# Patient Record
Sex: Female | Born: 1980
Health system: Southern US, Community
[De-identification: ages and names within clinical notes are randomized; demographics above are authoritative.]

## PROBLEM LIST (undated history)

## (undated) DIAGNOSIS — M21612 Bunion of left foot: Secondary | ICD-10-CM

## (undated) DIAGNOSIS — N390 Urinary tract infection, site not specified: Secondary | ICD-10-CM

## (undated) DIAGNOSIS — Z973 Presence of spectacles and contact lenses: Secondary | ICD-10-CM

## (undated) DIAGNOSIS — M546 Pain in thoracic spine: Secondary | ICD-10-CM

## (undated) DIAGNOSIS — M9902 Segmental and somatic dysfunction of thoracic region: Secondary | ICD-10-CM

## (undated) DIAGNOSIS — M5136 Other intervertebral disc degeneration, lumbar region: Secondary | ICD-10-CM

## (undated) DIAGNOSIS — M21611 Bunion of right foot: Secondary | ICD-10-CM

## (undated) DIAGNOSIS — Z8742 Personal history of other diseases of the female genital tract: Secondary | ICD-10-CM

## (undated) DIAGNOSIS — T7840XA Allergy, unspecified, initial encounter: Secondary | ICD-10-CM

## (undated) DIAGNOSIS — N870 Mild cervical dysplasia: Secondary | ICD-10-CM

## (undated) DIAGNOSIS — M5126 Other intervertebral disc displacement, lumbar region: Secondary | ICD-10-CM

## (undated) DIAGNOSIS — K219 Gastro-esophageal reflux disease without esophagitis: Secondary | ICD-10-CM

## (undated) DIAGNOSIS — L309 Dermatitis, unspecified: Secondary | ICD-10-CM

## (undated) DIAGNOSIS — M51369 Other intervertebral disc degeneration, lumbar region without mention of lumbar back pain or lower extremity pain: Secondary | ICD-10-CM

## (undated) DIAGNOSIS — G5600 Carpal tunnel syndrome, unspecified upper limb: Secondary | ICD-10-CM

## (undated) HISTORY — DX: Urinary tract infection, site not specified: N39.0

## (undated) HISTORY — DX: Allergy, unspecified, initial encounter: T78.40XA

## (undated) HISTORY — DX: Bunion of right foot: M21.611

## (undated) HISTORY — DX: Pain in thoracic spine: M54.6

## (undated) HISTORY — DX: Presence of spectacles and contact lenses: Z97.3

## (undated) HISTORY — DX: Dermatitis, unspecified: L30.9

## (undated) HISTORY — DX: Other intervertebral disc degeneration, lumbar region: M51.36

## (undated) HISTORY — DX: Other intervertebral disc degeneration, lumbar region without mention of lumbar back pain or lower extremity pain: M51.369

## (undated) HISTORY — DX: Gastro-esophageal reflux disease without esophagitis: K21.9

## (undated) HISTORY — DX: Segmental and somatic dysfunction of thoracic region: M99.02

## (undated) HISTORY — PX: COLPOSCOPY: SHX161

## (undated) HISTORY — DX: Carpal tunnel syndrome, unspecified upper limb: G56.00

## (undated) HISTORY — DX: Bunion of left foot: M21.611

## (undated) HISTORY — DX: Bunion of left foot: M21.612

## (undated) HISTORY — DX: Personal history of other diseases of the female genital tract: Z87.42

## (undated) HISTORY — DX: Mild cervical dysplasia: N87.0

## (undated) HISTORY — DX: Other intervertebral disc displacement, lumbar region: M51.26

---

## 2002-09-30 ENCOUNTER — Other Ambulatory Visit: Admission: RE | Admit: 2002-09-30 | Discharge: 2002-09-30 | Payer: Self-pay | Admitting: Obstetrics and Gynecology

## 2003-10-05 ENCOUNTER — Other Ambulatory Visit: Admission: RE | Admit: 2003-10-05 | Discharge: 2003-10-05 | Payer: Self-pay | Admitting: Obstetrics and Gynecology

## 2006-04-26 ENCOUNTER — Other Ambulatory Visit: Admission: RE | Admit: 2006-04-26 | Discharge: 2006-04-26 | Payer: Self-pay | Admitting: Obstetrics & Gynecology

## 2007-05-26 ENCOUNTER — Other Ambulatory Visit: Admission: RE | Admit: 2007-05-26 | Discharge: 2007-05-26 | Payer: Self-pay | Admitting: Obstetrics & Gynecology

## 2007-10-01 ENCOUNTER — Other Ambulatory Visit: Admission: RE | Admit: 2007-10-01 | Discharge: 2007-10-01 | Payer: Self-pay | Admitting: Obstetrics and Gynecology

## 2008-02-13 ENCOUNTER — Other Ambulatory Visit: Admission: RE | Admit: 2008-02-13 | Discharge: 2008-02-13 | Payer: Self-pay | Admitting: Obstetrics and Gynecology

## 2008-02-26 ENCOUNTER — Other Ambulatory Visit: Admission: RE | Admit: 2008-02-26 | Discharge: 2008-02-26 | Payer: Self-pay | Admitting: Obstetrics and Gynecology

## 2008-05-31 ENCOUNTER — Other Ambulatory Visit: Admission: RE | Admit: 2008-05-31 | Discharge: 2008-05-31 | Payer: Self-pay | Admitting: Obstetrics and Gynecology

## 2008-07-23 ENCOUNTER — Other Ambulatory Visit: Admission: RE | Admit: 2008-07-23 | Discharge: 2008-07-23 | Payer: Self-pay | Admitting: Obstetrics and Gynecology

## 2010-04-12 ENCOUNTER — Encounter
Admission: RE | Admit: 2010-04-12 | Discharge: 2010-05-09 | Payer: Self-pay | Source: Home / Self Care | Attending: Obstetrics and Gynecology | Admitting: Obstetrics and Gynecology

## 2010-06-27 ENCOUNTER — Inpatient Hospital Stay (HOSPITAL_COMMUNITY)
Admission: AD | Admit: 2010-06-27 | Discharge: 2010-06-29 | DRG: 775 | Disposition: A | Discharge status: Home / Self Care | Payer: 59 | Attending: Obstetrics and Gynecology | Admitting: Obstetrics and Gynecology

## 2010-06-27 LAB — CBC
HCT: 35.3 % — ABNORMAL LOW (ref 36.0–46.0)
Hemoglobin: 11.6 g/dL — ABNORMAL LOW (ref 12.0–15.0)
MCH: 26.2 pg (ref 26.0–34.0)
MCHC: 32.9 g/dL (ref 30.0–36.0)
MCV: 79.7 fL (ref 78.0–100.0)
Platelets: 216 10*3/uL (ref 150–400)
RBC: 4.43 MIL/uL (ref 3.87–5.11)
RDW: 14 % (ref 11.5–15.5)
WBC: 11.2 10*3/uL — ABNORMAL HIGH (ref 4.0–10.5)

## 2010-06-27 LAB — RPR: RPR Ser Ql: NONREACTIVE

## 2010-06-28 LAB — CBC
HCT: 27.8 % — ABNORMAL LOW (ref 36.0–46.0)
Hemoglobin: 9.2 g/dL — ABNORMAL LOW (ref 12.0–15.0)
MCH: 26.3 pg (ref 26.0–34.0)
MCHC: 33.1 g/dL (ref 30.0–36.0)
MCV: 79.4 fL (ref 78.0–100.0)
Platelets: 177 10*3/uL (ref 150–400)
RBC: 3.5 MIL/uL — ABNORMAL LOW (ref 3.87–5.11)
RDW: 14.3 % (ref 11.5–15.5)
WBC: 14.9 10*3/uL — ABNORMAL HIGH (ref 4.0–10.5)

## 2010-11-07 ENCOUNTER — Ambulatory Visit (INDEPENDENT_AMBULATORY_CARE_PROVIDER_SITE_OTHER): Payer: 59 | Admitting: Family Medicine

## 2010-11-07 ENCOUNTER — Encounter: Payer: Self-pay | Admitting: Family Medicine

## 2010-11-07 DIAGNOSIS — M999 Biomechanical lesion, unspecified: Secondary | ICD-10-CM

## 2010-11-07 DIAGNOSIS — M9981 Other biomechanical lesions of cervical region: Secondary | ICD-10-CM

## 2010-11-07 DIAGNOSIS — M9908 Segmental and somatic dysfunction of rib cage: Secondary | ICD-10-CM

## 2010-11-07 DIAGNOSIS — M546 Pain in thoracic spine: Secondary | ICD-10-CM

## 2010-11-07 DIAGNOSIS — M542 Cervicalgia: Secondary | ICD-10-CM

## 2010-11-07 DIAGNOSIS — M9901 Segmental and somatic dysfunction of cervical region: Secondary | ICD-10-CM

## 2010-11-07 DIAGNOSIS — M9902 Segmental and somatic dysfunction of thoracic region: Secondary | ICD-10-CM

## 2010-11-08 DIAGNOSIS — M9908 Segmental and somatic dysfunction of rib cage: Secondary | ICD-10-CM | POA: Insufficient documentation

## 2010-11-08 DIAGNOSIS — M546 Pain in thoracic spine: Secondary | ICD-10-CM | POA: Insufficient documentation

## 2010-11-08 DIAGNOSIS — M9902 Segmental and somatic dysfunction of thoracic region: Secondary | ICD-10-CM | POA: Insufficient documentation

## 2010-11-08 DIAGNOSIS — M542 Cervicalgia: Secondary | ICD-10-CM | POA: Insufficient documentation

## 2010-11-08 DIAGNOSIS — M9901 Segmental and somatic dysfunction of cervical region: Secondary | ICD-10-CM | POA: Insufficient documentation

## 2010-11-08 NOTE — Assessment & Plan Note (Signed)
Neck pain likely cervical strain related to recent pregnancy & daily activities.  Associated somatic dysfunction. - Osteopathic manipulation completed in office with 40-50% improvement in symptoms. - Recommend use of tennis ball or golf ball along next to help massage area - Instructed on home technique for suboccipital release - Will avoid any medications as this time since she is breast feeding - Moist heat - Follow-up as needed

## 2010-11-08 NOTE — Assessment & Plan Note (Signed)
Thoracic strain with associated somatic dysfunction - Osteopathic manipulation completed as noted below - Moist heat - Avoid any medications since breast feeding - Tennis ball massage prn - Follow-up as needed

## 2010-11-08 NOTE — Assessment & Plan Note (Signed)
(+)  TART, AA Rr, C3 RrSr E, C5 RlSl E - OMT completed in office with suboccipital release, cervical kneading, FPR, and HVLA.  Tolerated OMT well, somatic dysfunction improved, pain improved by 40-50%.

## 2010-11-08 NOTE — Assessment & Plan Note (Signed)
(+)  TART, T2-5 RrSl N - OMT completed in office with soft tissue techniques & HVLA.  Somatic dysfunction improved & patient's pain improved by 40-50% after treatment

## 2010-11-08 NOTE — Progress Notes (Signed)
  Subjective:    Patient ID: Lisa Ayers, female    DOB: December 27, 1980, 30 y.o.   MRN: [id number]  HPI 30yo female new patient to office for evaluation of neck pain & upper back pain.  Pain intermittent for the last 66-months following delivery of baby 70-months ago.  Had some issues with rib pain during pregnancy for which she went to physical therapy, but no significant back pain.  Hx of intermittent upper back pain since her late teens.  No injury or trauma.  Pain currently in lower neck & upper back.  No radiation into her arms.  No associated numbness/tingling, no weakness.  Flares typically occur every 2-3 months.  Pain occasionally wakes her at night.  She is currently breast feeding & tries to keep baby in comfortable position with a pillow & alternating sides regularly.  Has not taken anything for the pain b/c breast feeding.  Works as Producer, television/film/video for [employer].  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  Denies tobacco or alcohol use  Review of Systems Per HPI, otherwise negative    Objective:   Physical Exam GEN: AOx3, NAD, pleasant, well-developed, well-nourished SKIN: no rashes/lesions RESP: respirations 15 & unlabored MSK: - C-spine: Decreased ROM in all planes by 25% with pain.  No midline TTP, mild paraspinal TTP bilaterally - L>R, mild associated spasm left paraspinal muscles extending into left trapezius.  Negative Spurlings b/l. - T-spine: no gross deformity, no midline tenderness.  TTP along right paraspinal muscles T1-5, mild spasm in this area - L-spine: FROM without pain, no scoliosis, able to toe-walk & heel walk.  Normal lower extremity strength - Shoulders: FROM b/l without pain.  Normal RTC strength b/l, neg impingement testing, neg empty can.  Normal grip strength.  Normal upper extremity strength. - Gait: no leg length difference, normal heel-to-toe without a limp NEURO: sensation intact  to light touch, DTR +2/4 bicep, tricep, brachioradialis VASC: pulses 2+ & symmetric radial arteries b/l OSTEOPATHIC: - C-spine: (+)TART, AA Rr, C3 RrSr E, C5 RlSl E - T-spine: (+)TART, T2-5 RrSl N - Ribs: elevated right 1st rib - L-spine: no somatic dysfunction       Assessment & Plan:

## 2010-11-08 NOTE — Assessment & Plan Note (Signed)
Elevated Rt 1st rib - OMT completed in office with FPR of 1st rib.  Somatic dysfunction corrected.

## 2010-11-30 ENCOUNTER — Inpatient Hospital Stay (INDEPENDENT_AMBULATORY_CARE_PROVIDER_SITE_OTHER)
Admission: RE | Admit: 2010-11-30 | Discharge: 2010-11-30 | Disposition: A | Discharge status: Home / Self Care | Payer: 59 | Source: Ambulatory Visit

## 2010-11-30 DIAGNOSIS — R198 Other specified symptoms and signs involving the digestive system and abdomen: Secondary | ICD-10-CM

## 2010-11-30 DIAGNOSIS — S139XXA Sprain of joints and ligaments of unspecified parts of neck, initial encounter: Secondary | ICD-10-CM

## 2010-12-20 HISTORY — PX: INTRAUTERINE DEVICE INSERTION: SHX323

## 2011-02-16 ENCOUNTER — Inpatient Hospital Stay (INDEPENDENT_AMBULATORY_CARE_PROVIDER_SITE_OTHER)
Admission: RE | Admit: 2011-02-16 | Discharge: 2011-02-16 | Disposition: A | Discharge status: Home / Self Care | Payer: 59 | Source: Ambulatory Visit | Attending: Family Medicine | Admitting: Family Medicine

## 2011-02-16 DIAGNOSIS — R059 Cough, unspecified: Secondary | ICD-10-CM

## 2011-02-16 DIAGNOSIS — R05 Cough: Secondary | ICD-10-CM

## 2011-04-23 ENCOUNTER — Encounter: Payer: Self-pay | Admitting: *Deleted

## 2011-04-25 ENCOUNTER — Encounter: Payer: Self-pay | Admitting: Family Medicine

## 2011-04-25 ENCOUNTER — Ambulatory Visit (INDEPENDENT_AMBULATORY_CARE_PROVIDER_SITE_OTHER): Payer: 59 | Admitting: Family Medicine

## 2011-04-25 VITALS — BP 104/64 | HR 64 | Ht 66.0 in | Wt 157.0 lb

## 2011-04-25 DIAGNOSIS — L309 Dermatitis, unspecified: Secondary | ICD-10-CM | POA: Insufficient documentation

## 2011-04-25 DIAGNOSIS — K219 Gastro-esophageal reflux disease without esophagitis: Secondary | ICD-10-CM

## 2011-04-25 DIAGNOSIS — M542 Cervicalgia: Secondary | ICD-10-CM

## 2011-04-25 DIAGNOSIS — M25539 Pain in unspecified wrist: Secondary | ICD-10-CM

## 2011-04-25 DIAGNOSIS — L259 Unspecified contact dermatitis, unspecified cause: Secondary | ICD-10-CM

## 2011-04-25 MED ORDER — TRIAMCINOLONE ACETONIDE 0.1 % EX CREA: TOPICAL_CREAM | Freq: Two times a day (BID) | CUTANEOUS | Status: AC

## 2011-04-25 NOTE — Progress Notes (Signed)
Lisa Ayers is a new patient here to establish care. Some concerns she UJW:JXBJYN, hand/wrist pain b/l, back,neck and shoulder pain. Also heartburn x 4-5 years.  The only current/active concern is a spot of eczema on her L leg, requesting a steroid cream, as she has run out.  GERD--had reflux severely prior to pregnancy, somewhat better during pregnancy, and much improved now.  Currently takes Nexium just as needed, once a week. Seems to be triggered by baked goods, and crackers.  Eczema--has had x 10 years.  Was severe on her abdomen during a time where she was on ABX for chronic UTI's.  Now, just occasionally gets a spot on her legs.  Her creams have expired and is requesting a refill. She has a spot on her left thigh that has been present for about a month, mildly itchy.  Neck and shoulder pain--she gets cricks in her neck 2-3x/year, where she goes to an urgent care.  Yoga makes it worse initially, then gets better, but she hasn't had time for that.  Has been running recently, and it is improved.  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  Bilateral wrist pain since she had her baby 10 months ago.  Has pain with putting weight on her wrists, pronating/supinating hands, and when her daughter pulls on her thumbs.  Denies any numbness, tingling; no h/o carpal tunnel during pregnancy.  Hasn't tried anything for it--ie hasn't taken any NSAIDs.  Wore a carpal tunnel brace that someone gave her, but she couldn't wear it at work due to frequent hand washing. Wakes up with her hands hurting (no numbness)  History reviewed. No pertinent past medical history.  History reviewed. No pertinent past surgical history.  History   Social History  . Marital Status: Single    Spouse Name: N/A    Number of Children: 1  . Years of Education: N/A   Occupational History  . speech pathologist [place]   Social  History Main Topics  . Smoking status: Never Smoker   . Smokeless tobacco: Never Used  . Alcohol Use: Yes     0-1 drinks per week (still nursing).  previously drank 0-5 drinks/week  . Drug Use: No  . Sexually Active: Yes -- Female partner(s)    Birth Control/ Protection: IUD   Other Topics Concern  . Not on file   Social History Narrative   Lives with her boyfriend and her daughter, 1 dog    Family History  Problem Relation Age of Onset  . Depression Mother   . Diabetes Mother   . Diabetes Father   . Diabetes Maternal Grandmother   . Heart disease Maternal Grandmother   . Stroke Maternal Grandmother   . Cancer Paternal Grandmother 51    breast cancer   Current meds: Nexium 40 mg qd prn  Allergies  Allergen Reactions  . Sulfur Swelling    Of tongue.   ROS:  Denies fevers, URI symptoms, cough, shortness of breath, headaches, dizziness, chest pain, nausea, vomiting, bowel changes. +wrist pain, rash/eczema.  See HPI  PHYSICAL EXAM: BP 104/64  Pulse 64  Ht 5\' 6"  (1.676 m)  Wt 157 lb (71.215 kg)  BMI 25.34 kg/m2  LMP 04/12/2011 Well developed, pleasant female in no distress HEENT: PERRL, EOMI, conjunctiva clear, OP clear Neck: no lymphadenopathy, thyromegaly, carotid bruit or mass.  Spine nontender, no muscle spasm Back: no  spine or CVA tenderness Abdomen: soft, nontender, no organomegaly or mass Extremities: no clubbing, cyanosis or edema. 2+ pulses.  Negative Finkelstein test bilaterally.  Pain with position of Phalen test only, but did not cause symptoms into hand or elsewhere (just wrist discomfort in that flexed position).  Negative Tinel. Pain with supination and pronation against resistance. Normal strength Skin:  L lateral thigh--round, thickened, hyperkeratotic lesion.  ASSESSMENT/PLAN: 1. Eczema  triamcinolone cream (KENALOG) 0.1 %  2. GERD (gastroesophageal reflux disease)    3. Wrist pain    4. Neck pain     GERD--doing well overall.  Discussed reflux  precautions/diet, and to use Nexium prior to meal if known to cause reflux symptoms.  Discussed using faster-acting antacids once she already has symptoms  Numular eczema--moisturize; TAC 0.1% cream Neck and shoulder pain--shown stretches and strengthening exercises Wrist pain, bilateral--suspect overuse, inflammation.  Trial of wrist brace and OTC NSAID (Aleve up to 2 BID)

## 2011-04-25 NOTE — Patient Instructions (Signed)
Reflux--try seeing which foods trigger, and using the Nexium PRIOR to the meal, rather than waiting for symptoms. OR use mylanta/maalox or a Pepcid complete once you have the heartburn  Numular eczema--moisturize; TAC 0.1% cream twice daily for up to 2 weeks  Neck and shoulder pain--shown stretches and strengthening exercises--do these twice daily, 5-10 repetitions in each direction  Wrist pain, bilateral--suspect overuse, inflammation.  Trial of wrist braces and OTC NSAID (Aleve, up to 2 tablets twice daily--take with food, and stop if bothering your stomach)

## 2011-06-15 ENCOUNTER — Encounter: Payer: Self-pay | Admitting: Medical

## 2011-06-15 ENCOUNTER — Ambulatory Visit (INDEPENDENT_AMBULATORY_CARE_PROVIDER_SITE_OTHER): Payer: 59 | Admitting: Medical

## 2011-06-15 DIAGNOSIS — J029 Acute pharyngitis, unspecified: Secondary | ICD-10-CM | POA: Insufficient documentation

## 2011-06-15 DIAGNOSIS — H68009 Unspecified Eustachian salpingitis, unspecified ear: Secondary | ICD-10-CM

## 2011-06-15 MED ORDER — MOMETASONE FUROATE 50 MCG/ACT NA SUSP: 2.0000 | Freq: Every day | NASAL | Status: DC

## 2011-06-15 NOTE — Progress Notes (Signed)
Subjective: Mrs. Kiesel is here for c/o body aches, sore throat, chills, feeling exhausted, runny nose x 1 day.  She denies fever. Using Tylenol for pain.  Her right lymph node seems tender and swollen.  She c/o right ear fullness, some difficulty swallowing.  Denies cough, dizziness, nausea.  +sick contacts, works as Doctor, general practice.  ROS: Gen: no fever Cardiac: no cp Lungs: no SOB or wheezing GI: No NVD Neuro: no headache  Objective: Gen: WD, WN, NAD, mildly ill appearing HEENT: no sinus tenderness, right TM with mild erythema and serous effusion, left TM normal, turbinate swollen, mild erythema, pharynx with mildly enlarged tonsils, erythema, no exudate Cardiac: RRR, normal S1, S2 Lungs: CTA bilat, no wheezes, rhonchi or rales Neck: shoddy tender right sided lymphadenopathy, but no mass, no thyromegaly, supple  Assessment: Encounter Diagnoses  Name Primary?  . Eustachian salpingitis Yes  . Pharyngitis     Plan: Viral etiology at current.  Discussed supportive care, good hydration, sample of Nasonex, c/t Tylenol for pain, call if worse or not improving.

## 2011-06-15 NOTE — Progress Notes (Signed)
Addended by: Jac Canavan on: 06/15/2011 08:20 PM   Modules accepted: Orders

## 2011-06-15 NOTE — Patient Instructions (Signed)
Use the Nasonex nasal spray, 1-2 sprays per nostril, once to twice daily for nasal congestion.  Consider OTC Benadryl or Zyrtec for congestion, hydrate well, rest.  Call if not much improved by early next week.   Barotitis Media Barotitis media is soreness (inflammation) of the area behind the eardrum (middle ear). This occurs when the auditory tube (Eustachian tube) leading from the back of the throat to the eardrum is blocked. When it is blocked air cannot move in and out of the middle ear to equalize pressure changes. These pressure changes come from changes in altitude when:  Flying.   Driving in the mountains.   Diving.  Problems are more likely to occur with pressure changes during times when you are congested as from:  Hay fever.   Upper respiratory infection.   A cold.  Damage or hearing loss (barotrauma) caused by this may be permanent. HOME CARE INSTRUCTIONS   Use medicines as recommended by your caregiver. Over the counter medicines will help unblock the canal and can help during times of air travel.   Do not put anything into your ears to clean or unplug them. Eardrops will not be helpful.   Do not swim, dive, or fly until your caregiver says it is all right to do so. If these activities are necessary, chewing gum with frequent swallowing may help. It is also helpful to hold your nose and gently blow to pop your ears for equalizing pressure changes. This forces air into the Eustachian tube.   For little ones with problems, give your baby a bottle of water or juice during periods when pressure changes would be anticipated such as during take offs and landings associated with air travel.   Only take over-the-counter or prescription medicines for pain, discomfort, or fever as directed by your caregiver.   A decongestant may be helpful in de-congesting the middle ear and make pressure equalization easier. This can be even more effective if the drops (spray) are delivered with  the head lying over the edge of a bed with the head tilted toward the ear on the affected side.   If your caregiver has given you a follow-up appointment, it is very important to keep that appointment. Not keeping the appointment could result in a chronic or permanent injury, pain, hearing loss and disability. If there is any problem keeping the appointment, you must call back to this facility for assistance.  SEEK IMMEDIATE MEDICAL CARE IF:   You develop a severe headache, dizziness, severe ear pain, or bloody or pus-like drainage from your ears.   An oral temperature above 102 F (38.9 C) develops.   Your problems do not improve or become worse.  MAKE SURE YOU:   Understand these instructions.   Will watch your condition.   Will get help right away if you are not doing well or get worse.  Document Released: 05/11/2000 Document Revised: 01/24/2011 Document Reviewed: 12/18/2007 [facility] [employer] [facility] Patient Information 2012 [place], [place].

## 2011-06-19 LAB — POCT RAPID STREP A (OFFICE): Rapid Strep A Screen: NEGATIVE

## 2011-06-21 ENCOUNTER — Telehealth: Payer: Self-pay | Admitting: Family Medicine

## 2011-06-21 ENCOUNTER — Other Ambulatory Visit: Payer: Self-pay | Admitting: Medical

## 2011-06-21 MED ORDER — AMOXICILLIN 875 MG PO TABS: 875.0000 mg | ORAL_TABLET | Freq: Two times a day (BID) | ORAL | Status: AC

## 2011-06-21 NOTE — Telephone Encounter (Signed)
rx for amoxicillin sent 

## 2011-06-21 NOTE — Telephone Encounter (Signed)
Pt called and her ear is worse, she states ear infection now.  Please call in rx to [facility] outpt pharm

## 2011-07-10 ENCOUNTER — Ambulatory Visit (INDEPENDENT_AMBULATORY_CARE_PROVIDER_SITE_OTHER): Payer: 59 | Admitting: Medical

## 2011-07-10 ENCOUNTER — Encounter: Payer: Self-pay | Admitting: Medical

## 2011-07-10 VITALS — BP 110/68 | HR 64 | Temp 97.6°F | Resp 18 | Wt 154.0 lb

## 2011-07-10 DIAGNOSIS — J329 Chronic sinusitis, unspecified: Secondary | ICD-10-CM | POA: Insufficient documentation

## 2011-07-10 MED ORDER — FLUCONAZOLE 150 MG PO TABS: 150.0000 mg | ORAL_TABLET | Freq: Once | ORAL | Status: AC

## 2011-07-10 MED ORDER — AMOXICILLIN-POT CLAVULANATE 875-125 MG PO TABS: 1.0000 | ORAL_TABLET | Freq: Two times a day (BID) | ORAL | Status: AC

## 2011-07-10 NOTE — Progress Notes (Signed)
Subjective:  Lisa Ayers is a 31 y.o. female who presents for sinuses concerns.  Saw me 06/15/11 for sinus concerns, had viral URI at that time, but developed ear pain and worse symptoms within a few days.  She reports nasal congestion, thick yellow consistent nasal discharge, stuffy head, no taste currently.  Amoxicillin was sent, she got mostly better, but still not completely resolved.  Today she notes nasal congestion, thick yellow nasal drainage, sinus pressure, lingering symptoms.   Using Sudafed. +sick contacts - works in Teacher, music.  Going to [employer] this week for a wedding.  Gets sinus infections 1-2 times per year.  Denies recent allergy problems.  She is still nursing.  No other aggravating or relieving factors.  No other c/o.  Past Medical History  Diagnosis Date  . GERD (gastroesophageal reflux disease)    ROS: Gen: no fever, chills, sweats GI: no abdominal pain, NVD Lungs: no SOB, wheezing Herat: no CP, palpitations   Objective:   Filed Vitals:   07/10/11 0941  BP: 110/68  Pulse: 64  Temp: 97.6 F (36.4 C)  Resp: 18    General appearance: Alert, WD/WN, no distress, nasal sounding                             Skin: warm, no rash                           Head: +frontal sinus tenderness                            Eyes: conjunctiva normal, corneas clear, PERRLA                            Ears: TM with decreased light reflex, external ear canals normal                          Nose: septum midline, turbinates swollen, with erythema and clear discharge             Mouth/throat: MMM, tongue normal, mild pharyngeal erythema                           Neck: supple, no adenopathy, no thyromegaly, nontender                          Heart: RRR, normal S1, S2, no murmurs                         Lungs: CTA bilaterally, no wheezes, rales, or rhonchi      Assessment and Plan:   Encounter Diagnosis  Name Primary?  . Sinusitis Yes   Prescription given for Augmentin, and script  for Diflucan in the event of secondary yeast infection.  Can c/t OTC decongestant she has been using for congestion.  Tylenol OTC for fever and malaise.  Reassured that she can use nasal saline.  Discussed symptomatic relief, nasal saline, and call or return if worse or not improving in 2-3 days.

## 2011-07-30 ENCOUNTER — Ambulatory Visit (INDEPENDENT_AMBULATORY_CARE_PROVIDER_SITE_OTHER): Payer: 59 | Admitting: Medical

## 2011-07-30 ENCOUNTER — Encounter: Payer: Self-pay | Admitting: Medical

## 2011-07-30 VITALS — BP 110/80 | HR 68 | Temp 98.0°F | Resp 16 | Wt 157.0 lb

## 2011-07-30 DIAGNOSIS — J069 Acute upper respiratory infection, unspecified: Secondary | ICD-10-CM

## 2011-07-30 DIAGNOSIS — H109 Unspecified conjunctivitis: Secondary | ICD-10-CM

## 2011-07-30 MED ORDER — PSEUDOEPH-HYDROCODONE 60-5 MG/5ML PO SOLN: 5.0000 mL | Freq: Every evening | ORAL | Status: DC | PRN

## 2011-07-30 MED ORDER — BACITRACIN 500 UNIT/GM OP OINT: TOPICAL_OINTMENT | Freq: Four times a day (QID) | OPHTHALMIC | Status: AC

## 2011-07-30 MED ORDER — AZELASTINE HCL 0.1 % NA SOLN: 1.0000 | Freq: Two times a day (BID) | NASAL | Status: DC

## 2011-07-30 NOTE — Progress Notes (Signed)
Subjective:  Lisa Ayers is a 31 y.o. female who presents for 1 day hx/o left eye symptoms.  She notes left eye this morning was runny, goopy, matted shut, red, and thinks she has pink eye. No sick contacts with similar.  She also notes daughter started getting sick few days ago, then she has now started having nasal congestion, head cold, cough, and not sleeping due to cough.  No other aggravating or relieving factors.  No other c/o.  Past Medical History  Diagnosis Date  . GERD (gastroesophageal reflux disease)    ROS  Gen: no fever, chills, sweats GI: no NVD GU: negative Lungs: no SOB, wheezing As noted in HPI otherwise  Objective:   Filed Vitals:   07/30/11 1012  BP: 110/80  Pulse: 68  Temp: 98 F (36.7 C)  Resp: 16    General appearance: Alert, WD/WN, no distress, mildly ill appearing                             Skin: warm, no rash                           Head: no sinus tenderness                            Eyes: left conjunctiva injected, no obvious purulent discharge, no FB, corneas clear, PERRLA                            Ears: pearly TMs, external ear canals normal                          Nose: septum midline, turbinates swollen, with erythema and clear discharge             Mouth/throat: MMM, tongue normal, mild pharyngeal erythema                           Neck: supple, no adenopathy, no thyromegaly, nontender                          Heart: RRR, normal S1, S2, no murmurs                         Lungs: CTA bilaterally, no wheezes, rales, or rhonchi     Assessment and Plan:   Encounter Diagnoses  Name Primary?  . Conjunctivitis Yes  . URI (upper respiratory infection)    Conjunctiva -  discussed diagnosis, treatment, prevention, script for Bacitracin ointment.  URI - Samples of cough syrup prn QHS as below, sample of Astepro for nasal congestion since Nasonex hasn't helped much.  Call/return in 2-3 days if symptoms aren't resolving.

## 2011-07-30 NOTE — Patient Instructions (Signed)
Conjunctivitis Conjunctivitis is commonly called "pink eye." Conjunctivitis can be caused by bacterial or viral infection, allergies, or injuries. There is usually redness of the lining of the eye, itching, discomfort, and sometimes discharge. There may be deposits of matter along the eyelids. A viral infection usually causes a watery discharge, while a bacterial infection causes a yellowish, thick discharge. Pink eye is very contagious and spreads by direct contact. You may be given antibiotic eyedrops as part of your treatment. Before using your eye medicine, remove all drainage from the eye by washing gently with warm water and cotton balls. Continue to use the medication until you have awakened 2 mornings in a row without discharge from the eye. Do not rub your eye. This increases the irritation and helps spread infection. Use separate towels from other household members. Wash your hands with soap and water before and after touching your eyes. Use cold compresses to reduce pain and sunglasses to relieve irritation from light. Do not wear contact lenses or wear eye makeup until the infection is gone. SEEK MEDICAL CARE IF:   Your symptoms are not better after 3 days of treatment.   You have increased pain or trouble seeing.   The outer eyelids become very red or swollen.  Document Released: 06/21/2004 Document Revised: 05/03/2011 Document Reviewed: 05/14/2005 [employer] Patient Information 2012 [employer]. 

## 2012-03-17 LAB — HM PAP SMEAR: HM Pap smear: POSITIVE

## 2012-06-25 ENCOUNTER — Ambulatory Visit (INDEPENDENT_AMBULATORY_CARE_PROVIDER_SITE_OTHER): Payer: 59 | Admitting: Medical

## 2012-06-25 ENCOUNTER — Encounter: Payer: Self-pay | Admitting: Medical

## 2012-06-25 VITALS — BP 100/70 | HR 72 | Temp 98.6°F | Resp 14 | Wt 163.0 lb

## 2012-06-25 DIAGNOSIS — H6690 Otitis media, unspecified, unspecified ear: Secondary | ICD-10-CM

## 2012-06-25 DIAGNOSIS — J329 Chronic sinusitis, unspecified: Secondary | ICD-10-CM

## 2012-06-25 MED ORDER — CEFUROXIME AXETIL 500 MG PO TABS: 500.0000 mg | ORAL_TABLET | Freq: Two times a day (BID) | ORAL | Status: DC

## 2012-06-25 MED ORDER — CEFTRIAXONE SODIUM 1 G IJ SOLR
1.0000 g | Freq: Once | INTRAMUSCULAR | Status: AC
  Administered 2012-06-25: 1 g via INTRAMUSCULAR

## 2012-06-25 NOTE — Progress Notes (Signed)
Subjective:  Lisa Ayers is a 32 y.o. female who presents for illness.  Past history is significant for occasional hx/o sinusitis. Patient is a non-smoker.  Using sudafed for symptoms.  Daughter sick recently with RSV.   Husband and her both had colds recently, and she just hasn't gotten over the symptoms.   She notes over 1.5 wk of sinus pressure, cough, congestion, ears have been muffled, but right ear with lots of pain that started yesterday.  Denies SOB, CP, wheezing, NVD, fever.  No other aggravating or relieving factors.  No other c/o.   Past Medical History  Diagnosis Date  . GERD (gastroesophageal reflux disease)    ROS as above in subjective   Objective: Filed Vitals:   06/25/12 1436  BP: 100/70  Pulse: 72  Temp: 98.6 F (37 C)  Resp: 14   General appearance: Alert, WD/WN, no distress                             Skin: warm, no rash                           Head: +maxillary sinus tenderness,                            Eyes: conjunctiva normal, corneas clear, PERRLA                            Ears: right TM with erythema and retraction, left TM flat, external ear canals normal                          Nose: septum midline, turbinates swollen, with erythema and clear discharge             Mouth/throat: MMM, tongue normal, mild pharyngeal erythema                           Neck: supple, no adenopathy, no thyromegaly, nontender                          Heart: RRR, normal S1, S2, no murmurs                         Lungs: CTA bilaterally, no wheezes, rales, or rhonchi      Assessment and Plan:   Encounter Diagnoses  Name Primary?  . Otitis media Yes  . Sinusitis    Discussed findings, diagnosis.   Gave 1gram Rocephin IM given in office.  Prescription given for Ceftin..  Can use OTC Mucinex for congestion.  Tylenol or Ibuprofen OTC for fever and malaise.  Discussed symptomatic relief, nasal saline, and call or return if worse or not improving in 3-5 days.

## 2012-06-25 NOTE — Addendum Note (Signed)
Addended by: Janeice Robinson on: 06/25/2012 03:59 PM   Modules accepted: Orders

## 2012-08-05 ENCOUNTER — Ambulatory Visit (INDEPENDENT_AMBULATORY_CARE_PROVIDER_SITE_OTHER): Payer: 59 | Admitting: Sports Medicine

## 2012-08-05 VITALS — BP 106/70 | Ht 66.0 in | Wt 157.0 lb

## 2012-08-05 DIAGNOSIS — R269 Unspecified abnormalities of gait and mobility: Secondary | ICD-10-CM | POA: Insufficient documentation

## 2012-08-05 DIAGNOSIS — M21611 Bunion of right foot: Secondary | ICD-10-CM | POA: Insufficient documentation

## 2012-08-05 DIAGNOSIS — M25572 Pain in left ankle and joints of left foot: Secondary | ICD-10-CM

## 2012-08-05 DIAGNOSIS — M21619 Bunion of unspecified foot: Secondary | ICD-10-CM

## 2012-08-05 DIAGNOSIS — M25579 Pain in unspecified ankle and joints of unspecified foot: Secondary | ICD-10-CM

## 2012-08-05 NOTE — Progress Notes (Signed)
Patient ID: Lisa Ayers, female   DOB: 05-05-1981, 32 y.o.   MRN: [id number]  HPI  Started running 2 years ago Left medial ankle started bothering her about 9 mos ago Started with running fast down a hill on 1 specific run Now pain comes back by 2 miles into run She has been able to keep running but cannot extend her runs more than 2-3 miles because of the pain Sharp pain and goes away quickly  Runs 15 MPW Tried first step orthotic but was uncomfortable in the arch.  Left lateral knee will hurt after left medial ankle if she pushes it  No swelling No giving way  Hx of bad ankle sprain in college  P exam NAD  Feet show Morton,s foot.  Subluxation of L ankle with marked pronation.  R foot moderate pronation, hallux valgus and early bunion formation.  Slight hallux valgus on the left/  no bunion.  No spinal scoliosis. Leg lengths equal. 5/5 strength on leg abductors bilaterally.  5/5 strength posterior tibialis muscle. ATF   ligament intact but slightly Loose drawer on R ankle  Running gait is neutral except for bilateral pronation as noted on static exam

## 2012-08-05 NOTE — Assessment & Plan Note (Signed)
We will try adding some soft arch support  Probably will need custom orthotics as already a lot of static breakdown  See if this lessens ankle pain

## 2012-08-05 NOTE — Assessment & Plan Note (Signed)
Hopefully a custom orthotic will slow the progression of her bunion

## 2012-08-05 NOTE — Assessment & Plan Note (Signed)
Exercises  Consider trial of ankle compression wrap  Recheck after HEP for 6 weeks

## 2012-10-22 ENCOUNTER — Ambulatory Visit (INDEPENDENT_AMBULATORY_CARE_PROVIDER_SITE_OTHER): Payer: 59 | Admitting: Family Medicine

## 2012-10-22 ENCOUNTER — Encounter: Payer: Self-pay | Admitting: Family Medicine

## 2012-10-22 VITALS — BP 108/64 | HR 72 | Temp 98.3°F | Ht 67.0 in | Wt 160.0 lb

## 2012-10-22 DIAGNOSIS — J069 Acute upper respiratory infection, unspecified: Secondary | ICD-10-CM

## 2012-10-22 NOTE — Patient Instructions (Signed)
Drink plenty of fluids. Use guaifenesin round the clock as directed (the one you have, use every 4 hours, vs getting a 12 hr version--Mucinex--to use twice daily. Continue use of decongestant as needed for nasal congestion, sinus pressure. Use sinus rinses (neti-pot or sinus rinse kit) once or twice daily to keep sinuses clear.  Call if symptoms last >10 days, if you develop fever, sinus pain, etc.

## 2012-10-22 NOTE — Progress Notes (Signed)
Chief Complaint  Patient presents with  . cold or allergies    sore throat on monday evening and then feeling bad tuesday and today   2 nights ago started with sore throat.  Yesterday she had increased fatigue, postnasal drainage; productive cough started last night.  Today she is having more sinus congestion and cough.  Denies fevers.  +sick contacts.  Nasal mucus is light yellow.  Unable to expectorate the phlegm, seems somewhat thick.  Denies shortness of breath or wheezing, but had some tightness/congestion in throat earlier today.  Has used guaifenesin at bedtime last night, and this morning (a 4 hr version); took sudafed this morning.  She started back on the Astepro last night.  Has not been using Nasonex.  Just restarted the claritin 2 days ago.  She has been having flare ups every 2 months with respiratory symptoms, similar to today.  She has a 2 yo daughter in daycare, who is also coughing today.  Past Medical History  Diagnosis Date  . GERD (gastroesophageal reflux disease)    History   Social History  . Marital Status: Single    Spouse Name: N/A    Number of Children: 1  . Years of Education: N/A   Occupational History  . speech pathologist [employer]   Social History Main Topics  . Smoking status: Never Smoker   . Smokeless tobacco: Never Used  . Alcohol Use: Yes     Comment: 0-1 drinks per week (still nursing).  previously drank 0-5 drinks/week  . Drug Use: No  . Sexually Active: Yes -- Female partner(s)    Birth Control/ Protection: IUD   Other Topics Concern  . Not on file   Social History Narrative   Lives with her boyfriend and her daughter, 1 dog    Current outpatient prescriptions:famotidine (PEPCID) 10 MG tablet, Take 10 mg by mouth at bedtime., Disp: , Rfl: ;  GUAIFENESIN PO, Take 1 tablet by mouth every 6 (six) hours as needed (cough)., Disp: , Rfl: ;  Loratadine (CLARITIN PO), Take by mouth., Disp: , Rfl: ;  azelastine (ASTEPRO) 137 MCG/SPRAY nasal  spray, Place 1 spray into the nose 2 (two) times daily. Use in each nostril as directed, Disp: 15 mL, Rfl: 0 mometasone (NASONEX) 50 MCG/ACT nasal spray, Place 2 sprays into the nose daily., Disp: 17 g, Rfl: 12;  Multiple Vitamin (MULTIVITAMIN) tablet, Take 1 tablet by mouth daily.  , Disp: , Rfl:  Took a sudafed this morning.  Allergies  Allergen Reactions  . Sulfur Swelling    Of tongue.   ROS:  Denies fevers, nausea, vomiting, diarrhea, skin rash, sinus pain, myalgias, abdominal pain or other symptoms.  PHYSICAL EXAM: BP 108/64  Pulse 72  Temp(Src) 98.3 F (36.8 C) (Oral)  Ht 5\' 7"  (1.702 m)  Wt 160 lb (72.576 kg)  BMI 25.05 kg/m2 Pleasant female, in no distress.  No coughing noted. HEENT:  Nasal mucosa moderately edematous, no erythema, whitish mucus in nares.  Sinuses nontender.  OP normal (wisdom teeth visible). Neck: no lymphadenopathy Heart: regular rate and rhythm without murmur Lungs: clear bilaterally Skin: no rash Psych: normal mood, affect Neuro: alert and oriented, cranial nerves intact, normal gait  ASSESSMENT/PLAN:  Acute upper respiratory infections of unspecified site  Supportive measures reviewed.  Recommended increased fluids, guaifenesin, decongestants.  Sounds more viral than allergic--can hold off on restarting the nasonex, astepro.  May use claritin if needed to help with drying, or for any allergy symptoms.  Reviewed  signs/symptoms of bacterial infection and call or f/u if symptoms persist/worsen

## 2013-05-06 ENCOUNTER — Encounter: Payer: Self-pay | Admitting: Medical

## 2013-05-06 ENCOUNTER — Ambulatory Visit (INDEPENDENT_AMBULATORY_CARE_PROVIDER_SITE_OTHER): Payer: 59 | Admitting: Medical

## 2013-05-06 VITALS — BP 102/70 | HR 72 | Temp 98.0°F | Resp 16 | Wt 154.0 lb

## 2013-05-06 DIAGNOSIS — H6690 Otitis media, unspecified, unspecified ear: Secondary | ICD-10-CM

## 2013-05-06 DIAGNOSIS — H6691 Otitis media, unspecified, right ear: Secondary | ICD-10-CM

## 2013-05-06 MED ORDER — MONTELUKAST SODIUM 10 MG PO TABS: 10.0000 mg | ORAL_TABLET | Freq: Every day | ORAL | Status: DC

## 2013-05-06 MED ORDER — FLUTICASONE PROPIONATE 50 MCG/ACT NA SUSP: 2.0000 | Freq: Every day | NASAL | Status: DC

## 2013-05-06 MED ORDER — AMOXICILLIN-POT CLAVULANATE 875-125 MG PO TABS: 1.0000 | ORAL_TABLET | Freq: Two times a day (BID) | ORAL | Status: DC

## 2013-05-06 NOTE — Progress Notes (Signed)
Subjective: Here for c/o ear pain, bilat.  Started 3 days ago with pain in right ear, ear fullness and loss of hearing bilat.  Has had slight sore throat, slight stuffy nose. Doesn't feel like the normal cold.  Has had sick contacts, works at hospital.  lymph nodes have felt swollen.  Has been taking ibuprofen.  Denies ear infection as a child, but has had ear infections the last 5 years . Was seen here for last one.  Denies ear drainage, fever, NVD, no cough, no sinus pressure.     Objective: Gen: wd, wn,nad  Skin: warm, dry HEENT: sinus nontender, conjunctiva normal, right TM with purulent effusion, erythema anteriorly, bulging TM, left TM normal, nares parent, pharynx with post nasal drainage Neck supple nontender, no lymphadenopathy, no mass Lungs clear   Assessment: Encounter Diagnosis  Name Primary?  . Otitis media, right Yes    Plan: Begin Augmentin, OTC decongestant short term.   Hydrate well.  Given several URI, sinusitis, ear infections in the past year, discuss preventative option and reasons for frequent URI.  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   She will c/t efforts at good hygiene, hand washing, will begin Singulair daily, Flonase nasal spray, and will use this regimen presumably through May/summer time.  If she continues to get documented sinusitis or OM, she may need to see ENT.  Recheck in 2wk on right OM.

## 2013-05-06 NOTE — Patient Instructions (Signed)
Begin Singulair daily at bedtime for allergy prevention.  Begin Flonase 1-2 sprays per nostril twice daily for a week, then once daily for at least several weeks.    Begin short term Augmentin for ear infection.    Consider neti pot/nasal saline regularly.

## 2013-05-29 ENCOUNTER — Ambulatory Visit (INDEPENDENT_AMBULATORY_CARE_PROVIDER_SITE_OTHER): Payer: 59 | Admitting: Medical

## 2013-05-29 VITALS — BP 110/80 | Temp 98.3°F | Wt 156.0 lb

## 2013-05-29 DIAGNOSIS — H68009 Unspecified Eustachian salpingitis, unspecified ear: Secondary | ICD-10-CM

## 2013-05-29 DIAGNOSIS — H68003 Unspecified Eustachian salpingitis, bilateral: Secondary | ICD-10-CM

## 2013-05-29 DIAGNOSIS — K219 Gastro-esophageal reflux disease without esophagitis: Secondary | ICD-10-CM

## 2013-05-29 MED ORDER — ALIGN 4 MG PO CAPS
1.0000 | ORAL_CAPSULE | Freq: Every day | ORAL | Status: DC
Start: 2013-05-29 — End: 2016-01-11

## 2013-05-29 MED ORDER — ESOMEPRAZOLE MAGNESIUM 40 MG PO CPDR: 40.0000 mg | DELAYED_RELEASE_CAPSULE | Freq: Every day | ORAL | Status: DC

## 2013-05-29 NOTE — Progress Notes (Signed)
                                                             Subjective:  Clearance Lisa Ayers is a 33 y.o. female who presents for recheck on ears and GERD.  Improved from the Augmentin, was doing much better, but still feels some right sided ear and neck pressure.  Wanted to recheck.  She is using Singulair and Flonase.    Hx/o GERD.  In the past has had significant problems with reflux.  When drinking alcohol and eating certain foods, has vomiting.  Augmentin really tore her stomach up.   Takes pepcid daily.   Has failed omeprazole, protonix, and ranitidine prior.  No prior EGD or GI visit.    No other aggravating or relieving factors.    No other c/o.  The following portions of the patient's history were reviewed and updated as appropriate: allergies, current medications, past family history, past medical history, past social history, past surgical history and problem list.  ROS Otherwise as in subjective above  Objective: Physical Exam  Vital signs reviewed  General appearance: alert, no distress, WD/WN HEENT: normocephalic, sclerae anicteric, conjunctiva pink and moist, left TM pearly, right with slight effusion, nares with mild turbinate edema, clear discharge, no erythema, pharynx normal Oral cavity: MMM, no lesions Neck: supple, no lymphadenopathy, no thyromegaly, no masses   Assessment: Encounter Diagnoses  Name Primary?  . Eustachian salpingitis, bilateral Yes  . GERD (gastroesophageal reflux disease)     Plan: Eustachian tube salpingitis-discussed proper technique with Flonase, continue Singulair, during periods of increased pressure or pain begin over-the-counter Sudafed or Mucinex and Neti pot.  Return prn.  Call report 2-3 wk and let me know how regimen is working.  If repeat OM in the next 4 months, consider ENT.  GERD - avoid triggers, avoid eating within 2 hours at bedtime, begin trial of Nexium, begin probiotic  Follow up: call report 2-3 wk

## 2013-06-25 ENCOUNTER — Other Ambulatory Visit: Payer: Self-pay | Admitting: Medical

## 2013-08-04 ENCOUNTER — Telehealth: Payer: Self-pay | Admitting: *Deleted

## 2013-08-04 NOTE — Telephone Encounter (Signed)
Recall update: Patient overdue for AEX and follow up pap.  Please contact herr to assist in scheduling. Notify me of appointment or if declines to schedule.

## 2013-08-05 ENCOUNTER — Other Ambulatory Visit: Payer: Self-pay | Admitting: Medical

## 2013-08-06 NOTE — Telephone Encounter (Signed)
Can we take out of current recall and change to 3/15?

## 2013-08-06 NOTE — Telephone Encounter (Signed)
Annual Exam/ Pap appt scheduled for 08/11/13 with P.Grubb

## 2013-08-07 NOTE — Telephone Encounter (Signed)
Previous recall completed and new recall for 08-24-13 entered into EPIC.   Encounter closed.

## 2013-08-11 ENCOUNTER — Ambulatory Visit (INDEPENDENT_AMBULATORY_CARE_PROVIDER_SITE_OTHER): Payer: 59 | Admitting: Nurse Practitioner

## 2013-08-11 ENCOUNTER — Encounter: Payer: Self-pay | Admitting: Nurse Practitioner

## 2013-08-11 ENCOUNTER — Encounter: Payer: Self-pay | Admitting: *Deleted

## 2013-08-11 VITALS — BP 106/64 | HR 61 | Resp 20 | Ht 66.0 in | Wt 154.0 lb

## 2013-08-11 DIAGNOSIS — Z01419 Encounter for gynecological examination (general) (routine) without abnormal findings: Secondary | ICD-10-CM

## 2013-08-11 DIAGNOSIS — Z0000 Encounter for general adult medical examination without abnormal findings: Secondary | ICD-10-CM

## 2013-08-11 LAB — POCT URINALYSIS DIPSTICK
Bilirubin, UA: NEGATIVE
Glucose, UA: NEGATIVE
Ketones, UA: NEGATIVE
Leukocytes, UA: NEGATIVE
Nitrite, UA: NEGATIVE
Protein, UA: NEGATIVE
Urobilinogen, UA: NEGATIVE
pH, UA: 6

## 2013-08-11 LAB — HEMOGLOBIN, FINGERSTICK: Hemoglobin, fingerstick: 11.4 g/dL — ABNORMAL LOW (ref 12.0–16.0)

## 2013-08-11 NOTE — Progress Notes (Signed)
33 y.o. G1P1001 Single Caucasian Fe here for annual exam.  Same partner for 5 years.  Menses now at 6-7 days. Heavy for 3 days, moderate to light at the end. Some cramps relief with OTC NSAID's. Paraguard in 12/20/2010 insertion while she was breastfeeding.  Now menses are heavier than before and more cramps.  Has PMS 2 days before menses - she really wants something to help with that.  Patient's last menstrual period was 08/06/2013.          Sexually active: yes  The current method of family planning is IUD.    Exercising: yes  Running 20-30 min weekly Smoker:  no  Health Maintenance: Pap:  03/17/12 Abnormal TDaP:  2010 Labs: hemoglobin 11.4 UA: RBC -Trace. All else neg   reports that she has never smoked. She has never used smokeless tobacco. She reports that she drinks about 1.0 ounces of alcohol per week. She reports that she does not use illicit drugs.  Past Medical History  Diagnosis Date  . GERD (gastroesophageal reflux disease)   . History of abnormal cervical Pap smear 2009, 2010, 2013    Past Surgical History  Procedure Laterality Date  . Colposcopy    . Intrauterine device insertion  12/20/10    Paraguard    Current Outpatient Prescriptions  Medication Sig Dispense Refill  . fluticasone (FLONASE) 50 MCG/ACT nasal spray PLACE 2 SPRAYS INTO BOTH NOSTRILS DAILY.  16 g  2  . montelukast (SINGULAIR) 10 MG tablet Take 1 tablet (10 mg total) by mouth at bedtime.  30 tablet  5  . Multiple Vitamin (MULTIVITAMIN) tablet Take 1 tablet by mouth daily.      Marland Kitchen NEXIUM 40 MG capsule TAKE 1 CAPSULE BY MOUTH DAILY.  30 capsule  0  . Probiotic Product (ALIGN) 4 MG CAPS Take 1 capsule (4 mg total) by mouth daily.  30 capsule  3   No current facility-administered medications for this visit.    Family History  Problem Relation Age of Onset  . Depression Mother   . Diabetes Mother   . Thyroid disease Mother   . Diabetes Father   . Diabetes Maternal Grandmother   . Heart disease  Maternal Grandmother   . Stroke Maternal Grandmother   . Breast cancer Paternal Grandmother 70         ROS:  Pertinent items are noted in HPI.  Otherwise, a comprehensive ROS was negative.  Exam:   BP 106/64  Pulse 61  Resp 20  Ht 5\' 6"  (1.676 m)  Wt 154 lb (69.854 kg)  BMI 24.87 kg/m2  LMP 08/06/2013 Height: 5\' 6"  (167.6 cm)  Ht Readings from Last 3 Encounters:  08/11/13 5\' 6"  (1.676 m)  10/22/12 5\' 7"  (1.702 m)  08/05/12 5\' 6"  (1.676 m)    General appearance: alert, cooperative and appears stated age Head: Normocephalic, without obvious abnormality, atraumatic Neck: no adenopathy, supple, symmetrical, trachea midline and thyroid normal to inspection and palpation Lungs: clear to auscultation bilaterally Breasts: normal appearance, no masses or tenderness Heart: regular rate and rhythm Abdomen: soft, non-tender; no masses,  no organomegaly Extremities: extremities normal, atraumatic, no cyanosis or edema Skin: Skin color, texture, turgor normal. No rashes or lesions Lymph nodes: Cervical, supraclavicular, and axillary nodes normal. No abnormal inguinal nodes palpated Neurologic: Grossly normal   Pelvic: External genitalia:  no lesions              Urethra:  normal appearing urethra with no masses, tenderness or lesions  Bartholin's and Skene's: normal                 Vagina: normal appearing vagina with normal color and moderate vaginal bleeding, no lesions              Cervix: anteverted, IUD strings visible              Pap taken: yes Bimanual Exam:  Uterus:  normal size, contour, position, consistency, mobility, non-tender              Adnexa: no mass, fullness, tenderness               Rectovaginal: Confirms               Anus:  normal sphincter tone, no lesions  A:  Well Woman with normal exam  Paraguard IUD 12/20/2010  History of CIN I with Colpo biopsy X 3 - last one 1/ 2014  Menorrhagia and dysmenorrhea  PMS  P:   Pap smear as per  guidelines Done today  Information on Mirena IUD - she may be interested in changing  She did not do well on OCP in the past.  Counseled about treatment options for PMS since they are so bad for 2 days.  She declines Fluoxetine to use on CD 14 -28. I declined any anti anxiety agents secondary to dependency.  We discussed that she may do better on Mirena with bleeding but no guarantee that PMS will be gone.  Counseled on breast self exam, family planning choices, adequate intake of calcium and vitamin D, diet and exercise return annually or prn  An After Visit Summary was printed and given to the patient.

## 2013-08-11 NOTE — Progress Notes (Signed)
Encounter reviewed by Dr. Brook Silva.  

## 2013-08-11 NOTE — Patient Instructions (Signed)

## 2013-08-14 LAB — IPS PAP TEST WITH HPV

## 2013-09-30 ENCOUNTER — Telehealth: Payer: Self-pay | Admitting: Internal Medicine

## 2013-09-30 ENCOUNTER — Other Ambulatory Visit: Payer: Self-pay | Admitting: Family Medicine

## 2013-09-30 MED ORDER — ESOMEPRAZOLE MAGNESIUM 40 MG PO CPDR: 40.0000 mg | DELAYED_RELEASE_CAPSULE | Freq: Once | ORAL | Status: DC

## 2013-09-30 NOTE — Telephone Encounter (Signed)
Refill request for esomeprazole 40mg  #30 to [facility]

## 2013-09-30 NOTE — Telephone Encounter (Signed)
Rx refill sent to her pharmacy. CLS 

## 2014-01-13 ENCOUNTER — Telehealth: Payer: Self-pay | Admitting: Medical

## 2014-01-13 NOTE — Telephone Encounter (Signed)
[facility] from [facility]  montrlukast sod 10mg  #30 Last refill date     11/23/13

## 2014-01-14 MED ORDER — MONTELUKAST SODIUM 10 MG PO TABS: 10.0000 mg | ORAL_TABLET | Freq: Every day | ORAL | Status: DC

## 2014-01-26 ENCOUNTER — Other Ambulatory Visit: Payer: Self-pay | Admitting: Medical

## 2014-02-25 ENCOUNTER — Ambulatory Visit (INDEPENDENT_AMBULATORY_CARE_PROVIDER_SITE_OTHER): Payer: 59 | Admitting: Medical

## 2014-02-25 ENCOUNTER — Encounter: Payer: Self-pay | Admitting: Medical

## 2014-02-25 VITALS — BP 108/60 | HR 60 | Temp 98.2°F | Resp 15 | Wt 154.0 lb

## 2014-02-25 DIAGNOSIS — M436 Torticollis: Secondary | ICD-10-CM

## 2014-02-25 DIAGNOSIS — S39012A Strain of muscle, fascia and tendon of lower back, initial encounter: Secondary | ICD-10-CM

## 2014-02-25 DIAGNOSIS — S161XXA Strain of muscle, fascia and tendon at neck level, initial encounter: Secondary | ICD-10-CM

## 2014-02-25 MED ORDER — METHOCARBAMOL 500 MG PO TABS: 500.0000 mg | ORAL_TABLET | Freq: Four times a day (QID) | ORAL | Status: DC

## 2014-02-25 NOTE — Progress Notes (Signed)
Subjective: Here for neck/back pain.  She felt a bug crawl over her at 5am yesterday morning while in the bed.  She jumped up, turned, shakes, freaked out shaking the bug off her.   Felt a twitch in her neck/between shoulder blades, felt a pop.  Went and took 2 Advil right away.  Went to work.  Within 1.5 hours felt stiff and locked up in neck and upper blades.  Yesterday was intensely painful in the evening.   This was worse than she has felt in the past.  By noon felt some pain down right arm along with some tingling and numbness in back of right arm all the way down to pinky.  Took one of her husbands pain pills, hydrocodone.   Has continues to use some Ibuprofen.   medications wore off by 3pm.  Continued to medication the rest of the day with some relief.   This morning feels the pain some but is using the ibuprofen to help.  She does note some improved range of motion of neck this morning.   She has had similar muscle strain or spasm in the past but not this bad.  Denies fall, LOC, head injury, or specific trauma.   Since no prior arms symptoms like this, she wanted to get this checked out.   Using some heat.  No other aggravating or relieving factors.    She does note prior neck xray at [facility] years ago, was reported to have spasm, but no arthritis or other abnormality on xray.     ROS as in subjective  Objective BP 108/60  Pulse 60  Temp(Src) 98.2 F (36.8 C) (Oral)  Resp 15  Wt 154 lb (69.854 kg)  LMP 02/20/2014  Gen: wd, wn, nad Skin: unremarkable Neck: +spasm, tender right lateral neck, mild pain with ROM, ROM reduced in general with pain in right and posterior neck, otherwise supple, no mass, no lymphadenopathy Back: right upper back paraspinal muscles, rhomboids tender to touch, pain in same area with right shoulder ROM, otherwise back nontender MSK: right arm nontender, pain mildly with ROM of shoulder mostly in upper back, but otherwise ROM full Neuro: arms normal  strength, sensation, DTRs Pulses normal Ext: no edema  Assessment: Encounter Diagnoses  Name Primary?  . Torticollis, acquired Yes  . Back strain, initial encounter   . Neck strain, initial encounter     Plan: Discussed diagnosis, treatment, gentle ROM and stretching, Robaxin prn, discussed risks/benefits of medication, Ibuprofen OTC up to 800mg  q8hrs, heat and massage.  symptoms should gradually resolve.  If worse or not improving within a week, let me know.

## 2014-03-29 ENCOUNTER — Encounter: Payer: Self-pay | Admitting: Medical

## 2014-05-17 ENCOUNTER — Telehealth: Payer: 59 | Admitting: Family

## 2014-05-17 DIAGNOSIS — R6889 Other general symptoms and signs: Secondary | ICD-10-CM

## 2014-05-17 MED ORDER — OSELTAMIVIR PHOSPHATE 75 MG PO CAPS: 75.0000 mg | ORAL_CAPSULE | Freq: Two times a day (BID) | ORAL | Status: DC

## 2014-05-17 NOTE — Addendum Note (Signed)
Addended by: Jannifer RodneyHAWKS, CHRISTY A on: 05/17/2014 08:41 PM   Modules accepted: Orders

## 2014-05-17 NOTE — Progress Notes (Signed)
E visit for Flu like symptoms   We are sorry that you are not feeling well.  Here is how we plan to help! Based on what you have shared with me it looks like you may have a respiratory virus that may be influenza.  Influenza or "the flu" is   an infection caused by a respiratory virus. The flu virus is highly contagious and persons who did not receive their yearly flu vaccination may "catch" the flu from close contact. We have anti-viral medications to treat the viruses that cause this infection. They are not a "cure" and only shorten the course of the infection. These prescriptions are most effective when they are given within the first 2 days of "flu" symptoms. They are indicated for patients who have elevated risks of complications from infection or who might exposure close contacts who are at risk. These medications can help patients avoid complications from the flu  but have side effects that you should know. Possible side effects from Tamiflu or oseltamivir include nausea, vomiting, diarrhea, dizziness, headaches, eye redness, sleep problems or other respiratory symptoms. You should not take Tamiflu if you have an allergy to oseltamivir or any to the ingredients in Tamiflu. Based upon your symptoms and potential risk factors I have prescribed Oseltamivir (Tamiflu).  It has been sent to your designated pharmacy.  You will take one 75 mg capsule orally twice a day for the next 5 days.  ANYONE WHO HAS FLU SYMPTOMS SHOULD: . Stay home. The flu is highly contagious and going out or to work exposes others! . Be sure to drink plenty of fluids. Water is fine as well as fruit juices, sodas and electrolyte beverages. You may want to stay away from caffeine or alcohol. If you are nauseated, try taking small sips of liquids. How do you know if you are getting enough fluid? Your urine should be a pale yellow or almost colorless. . Get rest. . Taking a steamy shower or using a humidifier may help nasal  congestion and ease sore throat pain. Using a saline nasal spray works much the same way. . Cough drops, hard candies and sore throat lozenges may ease your cough. . Line up a caregiver. Have someone check on you regularly.   GET HELP RIGHT AWAY IF: . You cannot keep down liquids or your medications. . You become short of breath . Your fell like you are going to pass out or loose consciousness. . Your symptoms persist after you have completed your treatment plan MAKE SURE YOU   Understand these instructions.  Will watch your condition.  Will get help right away if you are not doing well or get worse.

## 2014-05-17 NOTE — Progress Notes (Signed)
E visit for Flu like symptoms   We are sorry that you are not feeling well.  Here is how we plan to help! Based on what you have shared with me it looks like you may have flu-like symptoms that should be watched but do not seem to indicate anti-viral treatment.  Influenza or "the flu" is   an infection caused by a respiratory virus. The flu virus is highly contagious and persons who did not receive their yearly flu vaccination may "catch" the flu from close contact. We have anti-viral medications to treat the viruses that cause this infection. They are not a "cure" and only shorten the course of the infection. These prescriptions are most effective when they are given within the first 2 days of "flu" symptoms. They are indicated for patients who have elevated risks of complications from infection or who might exposure close contacts who are at risk. These medications can help patients avoid complications from the flu  but have side effects that you should know. Possible side effects from Tamiflu or oseltamivir include nausea, vomiting, diarrhea, dizziness, headaches, eye redness, sleep problems or other respiratory symptoms. You should not take Tamiflu if you have an allergy to oseltamivir or any to the ingredients in Tamiflu. Based upon your symptoms and potential risk factors I recommend that you follow the flu symptoms recommendation that I have listed below.  ANYONE WHO HAS FLU SYMPTOMS SHOULD: . Stay home. The flu is highly contagious and going out or to work exposes others! . Be sure to drink plenty of fluids. Water is fine as well as fruit juices, sodas and electrolyte beverages. You may want to stay away from caffeine or alcohol. If you are nauseated, try taking small sips of liquids. How do you know if you are getting enough fluid? Your urine should be a pale yellow or almost colorless. . Get rest. . Taking a steamy shower or using a humidifier may help nasal congestion and ease sore throat  pain. Using a saline nasal spray works much the same way. . Cough drops, hard candies and sore throat lozenges may ease your cough. . Line up a caregiver. Have someone check on you regularly.   GET HELP RIGHT AWAY IF: . You cannot keep down liquids or your medications. . You become short of breath . Your fell like you are going to pass out or loose consciousness. . Your symptoms persist after you have completed your treatment plan MAKE SURE YOU   Understand these instructions.  Will watch your condition.  Will get help right away if you are not doing well or get worse.

## 2014-06-09 ENCOUNTER — Ambulatory Visit (INDEPENDENT_AMBULATORY_CARE_PROVIDER_SITE_OTHER): Payer: 59 | Admitting: Family Medicine

## 2014-06-09 ENCOUNTER — Encounter: Payer: Self-pay | Admitting: Family Medicine

## 2014-06-09 VITALS — BP 110/70 | HR 50 | Temp 97.5°F | Wt 156.0 lb

## 2014-06-09 DIAGNOSIS — J069 Acute upper respiratory infection, unspecified: Secondary | ICD-10-CM

## 2014-06-09 DIAGNOSIS — K219 Gastro-esophageal reflux disease without esophagitis: Secondary | ICD-10-CM

## 2014-06-09 MED ORDER — CLARITHROMYCIN 500 MG PO TABS: 500.0000 mg | ORAL_TABLET | Freq: Two times a day (BID) | ORAL | Status: DC

## 2014-06-09 NOTE — Progress Notes (Signed)
   Subjective:    Patient ID: Lisa Ayers, female    DOB: 03/22/1981, 34 y.o.   MRN: [id number]  HPI She complains of a five-day history this started with sore throat followed by nasal congestion, sneezing, sinus pressure with headache, PND and some maxillary sinus pain. She does not smoke. She does have underlying allergies and is using Singulair however no Flonase at the present time. She also has a history of reflux disease and does take Nexium. She notes that she has to take this on a daily basis in order to keep her symptoms under control and has been on this for several years. She has concerns over her symptoms being possibly rebound him try to stop the medication.   Review of Systems     Objective:   Physical Exam alert and in no distress. Tympanic membranes and canals are normal. Throat is clear. Tonsils are normal. Nasal mucosa is reddish purple with tenderness over maxillary sinuses. Neck is supple without adenopathy or thyromegaly. Cardiac exam shows a regular sinus rhythm without murmurs or gallops. Lungs are clear to auscultation.        Assessment & Plan:  Gastroesophageal reflux disease, esophagitis presence not specified - Plan: Ambulatory referral to Gastroenterology  Acute URI - Plan: clarithromycin (BIAXIN) 500 MG tablet  her symptoms are probably mainly URI however I would give her Biaxin and recommend she wait a day or 2 to see if symptoms clear. She was comfortable with this. We also discussed her reflux symptoms and I will have her see Dr. Randa EvensEdwards for consult concerning continuing on this medication and the possibility of getting an endoscopy.

## 2014-07-02 ENCOUNTER — Telehealth: Payer: Self-pay | Admitting: Nurse Practitioner

## 2014-07-02 DIAGNOSIS — Z30432 Encounter for removal of intrauterine contraceptive device: Secondary | ICD-10-CM

## 2014-07-02 DIAGNOSIS — Z30430 Encounter for insertion of intrauterine contraceptive device: Secondary | ICD-10-CM

## 2014-07-02 NOTE — Telephone Encounter (Signed)
Spoke with patient. Patient states that she is interested in switching her paraguard to a mirena. "I discussed this with Patty last year and think it may be better for me." Advised patient that we will need to get her scheduled for a consult appointment with an MD in the office to discuss IUD switch. Patient would like to know coverage costs with insurance before scheduling that appointment. Routing to [place] for precert.

## 2014-07-02 NOTE — Telephone Encounter (Signed)
Pt called to set up aex with Ms Lisa Freestoneatty in April 2016. Pt also had questions about switching from Teraguard to Mirena. Forwarded staff message to billing for insurance questions.

## 2014-07-05 NOTE — Telephone Encounter (Signed)
Pr $0 °

## 2014-07-05 NOTE — Telephone Encounter (Signed)
Left message for patient to call back  

## 2014-07-13 NOTE — Telephone Encounter (Signed)
Return calling [place].

## 2014-07-13 NOTE — Telephone Encounter (Signed)
Left message for patient to call back. Need to go over contraception benefits. °

## 2014-07-13 NOTE — Telephone Encounter (Signed)
Spoke with patient. Advised of IUD and removal and reinsertion benefit. Patient agreeable. Will call back to schedule.

## 2014-09-14 ENCOUNTER — Encounter: Payer: Self-pay | Admitting: Nurse Practitioner

## 2014-09-14 ENCOUNTER — Ambulatory Visit (INDEPENDENT_AMBULATORY_CARE_PROVIDER_SITE_OTHER): Payer: 59 | Admitting: Nurse Practitioner

## 2014-09-14 VITALS — BP 110/66 | HR 64 | Ht 66.0 in | Wt 150.0 lb

## 2014-09-14 DIAGNOSIS — Z0000 Encounter for general adult medical examination without abnormal findings: Secondary | ICD-10-CM

## 2014-09-14 DIAGNOSIS — N920 Excessive and frequent menstruation with regular cycle: Secondary | ICD-10-CM | POA: Diagnosis not present

## 2014-09-14 DIAGNOSIS — Z01419 Encounter for gynecological examination (general) (routine) without abnormal findings: Secondary | ICD-10-CM

## 2014-09-14 LAB — POCT URINALYSIS DIPSTICK
Bilirubin, UA: NEGATIVE
Blood, UA: NEGATIVE
Glucose, UA: NEGATIVE
Ketones, UA: NEGATIVE
Leukocytes, UA: NEGATIVE
Nitrite, UA: NEGATIVE
Protein, UA: NEGATIVE
Urobilinogen, UA: NEGATIVE
pH, UA: 6

## 2014-09-14 NOTE — Progress Notes (Signed)
Patient ID: Lisa Ayers, female   DOB: [birth year], 34 y.o.   MRN: [id number] 34 y.o. G1P1001 Single  Caucasian Fe here for annual exam.  Menses is heavy X 3-4 days.  Super tampon changing every 6-8 hours.  Last for 6 days.  Some cramps that are a little better and some PMS.   Same partner X 6 years.  Patient's last menstrual period was 09/09/2014 (exact date).          Sexually active: Yes.    The current method of family planning is IUD.   Paragard inserted 12/20/10. Exercising: Yes.    running 3 x week, strength 2 x week, tennis 3 x week Smoker:  no  Health Maintenance: Pap:  08/11/13, negative with neg HR HPV, colpo 06/04/12 LGSIL TDaP:  2010 Labs:  HB:  11.0  Urine:  Negative    reports that she has never smoked. She has never used smokeless tobacco. She reports that she drinks about 1.0 oz of alcohol per week. She reports that she does not use illicit drugs.  Past Medical History  Diagnosis Date  . GERD (gastroesophageal reflux disease)   . History of abnormal cervical Pap smear 2009, 2010, 2013    Past Surgical History  Procedure Laterality Date  . Colposcopy    . Intrauterine device insertion  12/20/10    Paraguard    Current Outpatient Prescriptions  Medication Sig Dispense Refill  . fluticasone (FLONASE) 50 MCG/ACT nasal spray PLACE 2 SPRAYS INTO BOTH NOSTRILS DAILY. 16 g 2  . montelukast (SINGULAIR) 10 MG tablet Take 1 tablet (10 mg total) by mouth at bedtime. 30 tablet 5  . Probiotic Product (ALIGN) 4 MG CAPS Take 1 capsule (4 mg total) by mouth daily. 30 capsule 3   No current facility-administered medications for this visit.    Family History  Problem Relation Age of Onset  . Depression Mother   . Diabetes Mother   . Thyroid disease Mother   . Diabetes Father   . Diabetes Maternal Grandmother   . Heart disease Maternal Grandmother   . Stroke Maternal Grandmother   . Breast cancer Paternal Grandmother 70         ROS:  Pertinent items are noted in HPI.   Otherwise, a comprehensive ROS was negative.  Exam:   BP 110/66 mmHg  Pulse 64  Ht  (1.676 m)  Wt 150 lb (68.04 kg)  BMI 24.22 kg/m2  LMP 09/09/2014 (Exact Date) Height:  (167.6 cm) Ht Readings from Last 3 Encounters:  09/14/14  (1.676 m)  08/11/13  (1.676 m)  10/22/12  (1.702 m)    General appearance: alert, cooperative and appears stated age Head: Normocephalic, without obvious abnormality, atraumatic Neck: no adenopathy, supple, symmetrical, trachea midline and thyroid normal to inspection and palpation Lungs: clear to auscultation bilaterally Breasts: normal appearance, no masses or tenderness Heart: regular rate and rhythm Abdomen: soft, non-tender; no masses,  no organomegaly Extremities: extremities normal, atraumatic, no cyanosis or edema Skin: Skin color, texture, turgor normal. No rashes or lesions Lymph nodes: Cervical, supraclavicular, and axillary nodes normal. No abnormal inguinal nodes palpated Neurologic: Grossly normal   Pelvic: External genitalia:  no lesions              Urethra:  normal appearing urethra with no masses, tenderness or lesions              Bartholin's and Skene's: normal  Vagina: normal appearing vagina with normal color and discharge, no lesions              Cervix: anteverted IUD strings are visible              Pap taken: Yes.   Bimanual Exam:  Uterus:  normal size, contour, position, consistency, mobility, non-tender              Adnexa: no mass, fullness, tenderness               Rectovaginal: Confirms               Anus:  normal sphincter tone, no lesions  Chaperone present: no  A:  Well Woman with normal exam  Paraguard IUD 12/20/2010 History of CIN I with Colpo biopsy X 3 - last one 1/ 2014 Menorrhagia and dysmenorrhea PMS   P:   Reviewed health and wellness pertinent to exam  Pap smear taken today  Discussed menorrhagia again and she feels symptoms  are tolerable and has decided against Mirena IUD unless symptoms worsens  Counseled on breast self exam, mammography screening, adequate intake of calcium and vitamin D, diet and exercise return annually or prn  An After Visit Summary was printed and given to the patient.

## 2014-09-14 NOTE — Patient Instructions (Signed)

## 2014-09-16 LAB — HEMOGLOBIN, FINGERSTICK: Hemoglobin, fingerstick: 11 g/dL — ABNORMAL LOW (ref 12.0–16.0)

## 2014-09-16 LAB — IPS PAP TEST WITH HPV

## 2014-09-16 NOTE — Progress Notes (Signed)
Encounter reviewed by Dr. Brook Silva.  

## 2014-12-01 ENCOUNTER — Other Ambulatory Visit: Payer: Self-pay | Admitting: Family Medicine

## 2014-12-01 ENCOUNTER — Telehealth: Payer: Self-pay | Admitting: Medical

## 2014-12-01 NOTE — Telephone Encounter (Signed)
Lisa Ayers I believe this is your patient

## 2014-12-01 NOTE — Telephone Encounter (Signed)
Schedule a physical.  I did send her singulair refill

## 2014-12-01 NOTE — Telephone Encounter (Signed)
Patient has an appointment scheduled for 12/22/14 for a physical

## 2014-12-22 ENCOUNTER — Encounter: Payer: Self-pay | Admitting: Medical

## 2015-01-19 ENCOUNTER — Encounter: Payer: Self-pay | Admitting: Medical

## 2015-01-19 ENCOUNTER — Ambulatory Visit (INDEPENDENT_AMBULATORY_CARE_PROVIDER_SITE_OTHER): Payer: 59 | Admitting: Medical

## 2015-01-19 ENCOUNTER — Other Ambulatory Visit: Payer: Self-pay | Admitting: Medical

## 2015-01-19 ENCOUNTER — Telehealth: Payer: Self-pay | Admitting: Medical

## 2015-01-19 VITALS — BP 108/62 | HR 84 | Temp 97.8°F | Resp 16 | Ht 67.0 in | Wt 155.6 lb

## 2015-01-19 DIAGNOSIS — L309 Dermatitis, unspecified: Secondary | ICD-10-CM | POA: Diagnosis not present

## 2015-01-19 DIAGNOSIS — K219 Gastro-esophageal reflux disease without esophagitis: Secondary | ICD-10-CM | POA: Diagnosis not present

## 2015-01-19 DIAGNOSIS — Z0000 Encounter for general adult medical examination without abnormal findings: Secondary | ICD-10-CM

## 2015-01-19 DIAGNOSIS — K2100 Gastro-esophageal reflux disease with esophagitis, without bleeding: Secondary | ICD-10-CM

## 2015-01-19 DIAGNOSIS — M5489 Other dorsalgia: Secondary | ICD-10-CM | POA: Diagnosis not present

## 2015-01-19 DIAGNOSIS — Z1231 Encounter for screening mammogram for malignant neoplasm of breast: Secondary | ICD-10-CM | POA: Insufficient documentation

## 2015-01-19 DIAGNOSIS — J309 Allergic rhinitis, unspecified: Secondary | ICD-10-CM

## 2015-01-19 DIAGNOSIS — Z1211 Encounter for screening for malignant neoplasm of colon: Secondary | ICD-10-CM

## 2015-01-19 LAB — POCT URINALYSIS DIPSTICK
Bilirubin, UA: NEGATIVE
Blood, UA: NEGATIVE
Glucose, UA: NEGATIVE
Ketones, UA: NEGATIVE
Leukocytes, UA: NEGATIVE
Nitrite, UA: NEGATIVE
Protein, UA: 0.15
Spec Grav, UA: >=1.03
Urobilinogen, UA: 4
pH, UA: 6

## 2015-01-19 LAB — CBC
HCT: 35.7 % — ABNORMAL LOW (ref 36.0–46.0)
Hemoglobin: 11.5 g/dL — ABNORMAL LOW (ref 12.0–15.0)
MCH: 25.6 pg — ABNORMAL LOW (ref 26.0–34.0)
MCHC: 32.2 g/dL (ref 30.0–36.0)
MCV: 79.5 fL (ref 78.0–100.0)
MPV: 8.9 fL (ref 8.6–12.4)
Platelets: 256 10*3/uL (ref 150–400)
RBC: 4.49 MIL/uL (ref 3.87–5.11)
RDW: 13.8 % (ref 11.5–15.5)
WBC: 7.9 10*3/uL (ref 4.0–10.5)

## 2015-01-19 LAB — COMPREHENSIVE METABOLIC PANEL
ALT: 11 U/L (ref 6–29)
AST: 24 U/L (ref 10–30)
Albumin: 4.1 g/dL (ref 3.6–5.1)
Alkaline Phosphatase: 127 U/L — ABNORMAL HIGH (ref 33–115)
BUN: 13 mg/dL (ref 7–25)
CO2: 24 mmol/L (ref 20–31)
Calcium: 9 mg/dL (ref 8.6–10.2)
Chloride: 106 mmol/L (ref 98–110)
Creat: 0.65 mg/dL (ref 0.50–1.10)
Glucose, Bld: 95 mg/dL (ref 65–99)
Potassium: 4 mmol/L (ref 3.5–5.3)
Sodium: 139 mmol/L (ref 135–146)
Total Bilirubin: 0.5 mg/dL (ref 0.2–1.2)
Total Protein: 6.7 g/dL (ref 6.1–8.1)

## 2015-01-19 LAB — LIPID PANEL
Cholesterol: 161 mg/dL (ref 125–200)
HDL: 75 mg/dL (ref >=46)
LDL Cholesterol: 71 mg/dL (ref <130)
Total CHOL/HDL Ratio: 2.1 Ratio (ref <=5.0)
Triglycerides: 75 mg/dL (ref <150)
VLDL: 15 mg/dL (ref <30)

## 2015-01-19 MED ORDER — METHOCARBAMOL 500 MG PO TABS: 500.0000 mg | ORAL_TABLET | Freq: Three times a day (TID) | ORAL | Status: DC | PRN

## 2015-01-19 MED ORDER — MONTELUKAST SODIUM 10 MG PO TABS: 10.0000 mg | ORAL_TABLET | Freq: Every day | ORAL | Status: DC

## 2015-01-19 MED ORDER — FLUTICASONE PROPIONATE 50 MCG/ACT NA SUSP: 1.0000 | Freq: Every day | NASAL | Status: DC

## 2015-01-19 NOTE — Progress Notes (Signed)
Subjective:   HPI  Lisa Ayers is a 34 y.o. female who presents for a complete physical.  Medical care team/other doctors includes:  Lisa Franklin, NP   Preventative care: Last tetanus vaccine, TD or Tdap: up to date per employment requirement   Gynecology history: Pregnancy history: 1 pregnancy, 1 live birth Contraception currently IUD Relationship status: long term relationship Concern for STD No  Concerns: Intermittent upper back pain - robaxin seems to work well with occasional flare up  GERD - hx/o GERD for years, was on Nexium for > 1year, but doesn't want to have to rely on this all the time.   Limited in what she can eat given the pain.  Saw Dr. Susann Givens here for the same months ago  Reviewed their medical, surgical, family, social, medication, and allergy history and updated chart as appropriate.  Past Medical History  Diagnosis Date  . GERD (gastroesophageal reflux disease)   . History of abnormal cervical Pap smear 2009, 2010, 2013  . Allergy   . Eczema   . Wears contact lenses   . Thoracic back pain   . Somatic dysfunction of spine, thoracic   . Bilateral bunions   . Urinary tract infection     several prior    Past Surgical History  Procedure Laterality Date  . Colposcopy    . Intrauterine device insertion  12/20/10    Paraguard    Social History   Social History  . Marital Status: Single    Spouse Name: N/A  . Number of Children: 1  . Years of Education: N/A   Occupational History  . speech pathologist [employer]   Social History Main Topics  . Smoking status: Never Smoker   . Smokeless tobacco: Never Used  . Alcohol Use: 1.8 oz/week    1 Cans of beer, 2 Standard drinks or equivalent per week  . Drug Use: No  . Sexual Activity:    Partners: Male    Birth Control/ Protection: IUD   Other Topics Concern  . Not on file   Social History Narrative   Lives with her boyfriend and her daughter Lisa Ayers, 1 dog.  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  Runs, likes to read.  Agnostic.      Family History  Problem Relation Age of Onset  . Depression Mother   . Diabetes Mother   . Thyroid disease Mother   . Glaucoma Mother   . Diabetes Father   . Diabetes Maternal Grandmother   . Stroke Maternal Grandmother   . Heart disease Maternal Grandmother     CABG  . Breast cancer Paternal Grandmother 75       . COPD Paternal Grandmother      Current outpatient prescriptions:  .  fluticasone (FLONASE) 50 MCG/ACT nasal spray, Place 1 spray into both nostrils daily., Disp: 16 g, Rfl: 11 .  montelukast (SINGULAIR) 10 MG tablet, Take 1 tablet (10 mg total) by mouth at bedtime., Disp: 90 tablet, Rfl: 3 .  Probiotic Product (ALIGN) 4 MG CAPS, Take 1 capsule (4 mg total) by mouth daily., Disp: 30 capsule, Rfl: 3  Allergies  Allergen Reactions  . Augmentin [Amoxicillin-Pot Clavulanate]     Upset stomach  . Sulfur Swelling    Of tongue.    Review of Systems Constitutional: -fever, -chills, -sweats, -unexpected weight change, -decreased appetite, -fatigue Allergy: -sneezing, -itching, -congestion Dermatology: -changing moles, --rash, -lumps ENT: -runny nose, -ear pain, -sore throat, -hoarseness, -sinus pain, -teeth pain, -  ringing in ears, -hearing loss, -nosebleeds Cardiology: -chest pain, -palpitations, -swelling, -difficulty breathing when lying flat, -waking up short of breath Respiratory: -cough, -shortness of breath, -difficulty breathing with exercise or exertion, -wheezing, -coughing up blood Gastroenterology: +GERD, -abdominal pain, -nausea, -vomiting, -diarrhea, -constipation, -blood in stool, -changes in bowel movement, -difficulty swallowing or eating Hematology: -bleeding, -bruising  Musculoskeletal: -joint aches, -muscle aches, -joint swelling, +intermittent back pain, -neck pain, -cramping, -changes in gait Ophthalmology: denies vision changes, eye redness, itching, discharge Urology: -burning with urination,  -difficulty urinating, -blood in urine, -urinary frequency, -urgency, -incontinence Neurology: -headache, -weakness, -tingling, -numbness, -memory loss, -falls, -dizziness Psychology: -depressed mood, -agitation, -sleep problems     Objective:   Physical Exam  BP 108/62 mmHg  Pulse 84  Temp(Src) 97.8 F (36.6 C) (Oral)  Resp 16  Ht 5\' 7"  (1.702 m)  Wt 155 lb 9.6 oz (70.58 kg)  BMI 24.36 kg/m2  General appearance: alert, no distress, WD/WN, white female Skin: right anterior upper arm with few hypopigmented flat patches, chronic per patient, suggestive of vitiligo.  Left upper chest with flat brown 1.5 cm faint patch, benign appearing, small pedunculated skin tag 2mm diameter of right upper abdomen, few scattered macules otherwise HEENT: normocephalic, conjunctiva/corneas normal, sclerae anicteric, PERRLA, EOMi, nares patent, no discharge or erythema, pharynx normal Oral cavity: MMM, tongue normal, teeth normal Neck: supple, no lymphadenopathy, no thyromegaly, no masses, normal ROM, no bruits Chest: non tender, normal shape and expansion Heart: RRR, normal S1, S2, no murmurs Lungs: CTA bilaterally, no wheezes, rhonchi, or rales Abdomen: +bs, soft, non tender, non distended, no masses, no hepatomegaly, no splenomegaly, no bruits Back: non tender, normal ROM, no scoliosis Musculoskeletal: upper extremities non tender, no obvious deformity, normal ROM throughout, lower extremities non tender, no obvious deformity, normal ROM throughout Extremities: no edema, no cyanosis, no clubbing Pulses: 2+ symmetric, upper and lower extremities, normal cap refill Neurological: alert, oriented x 3, CN2-12 intact, strength normal upper extremities and lower extremities, sensation normal throughout, DTRs 2+ throughout, no cerebellar signs, gait normal Psychiatric: normal affect, behavior normal, pleasant  Breast/gyn/rectal - deferred to gyn   Assessment and Plan :    Encounter Diagnoses  Name  Primary?  . Annual physical exam Yes  . Gastroesophageal reflux disease without esophagitis   . Allergic rhinitis, unspecified allergic rhinitis type   . Other back pain   . Eczema     Physical exam - discussed healthy lifestyle, diet, exercise, preventative care, vaccinations, and addressed their concerns.   See your dentist yearly for routine dental care including hygiene visits twice yearly. See your eye doctor yearly for routine vision care. See your gynecologist yearly for routine gynecological care. Routine labs today.  Vaccinations: Gets flu shot yearly at work  Contraception: IUD  Other concerns today: Back pain - c/t routine exercise, stretching, and can use Robaxin prn for flare up GERD - chronic, referral to GI  Follow up pending labs

## 2015-01-19 NOTE — Telephone Encounter (Signed)
Refer to GI.  Of note she was referred back in 05/2014, but never went.   She is a Producer, television/film/video.  So either refer to Dr. Randa Evens or other.

## 2015-01-20 ENCOUNTER — Other Ambulatory Visit: Payer: Self-pay | Admitting: Family Medicine

## 2015-01-20 ENCOUNTER — Encounter: Payer: Self-pay | Admitting: Internal Medicine

## 2015-01-20 LAB — VITAMIN D 25 HYDROXY (VIT D DEFICIENCY, FRACTURES): Vit D, 25-Hydroxy: 38 ng/mL (ref 30–100)

## 2015-01-20 NOTE — Addendum Note (Signed)
Addended by: Herminio Commons A on: 01/20/2015 08:23 AM   Modules accepted: Orders

## 2015-01-20 NOTE — Telephone Encounter (Signed)
Since she is a [employer] employee, I have referred to [facility]. They will contact her to schedule an appt with her.

## 2015-02-25 ENCOUNTER — Ambulatory Visit (INDEPENDENT_AMBULATORY_CARE_PROVIDER_SITE_OTHER): Payer: 59 | Admitting: Internal Medicine

## 2015-02-25 ENCOUNTER — Encounter: Payer: Self-pay | Admitting: Internal Medicine

## 2015-02-25 VITALS — BP 100/70 | HR 72 | Ht 66.0 in | Wt 148.0 lb

## 2015-02-25 DIAGNOSIS — D649 Anemia, unspecified: Secondary | ICD-10-CM | POA: Diagnosis not present

## 2015-02-25 DIAGNOSIS — K219 Gastro-esophageal reflux disease without esophagitis: Secondary | ICD-10-CM | POA: Diagnosis not present

## 2015-02-25 NOTE — Patient Instructions (Addendum)
Follow up with [place] as needed.  Continue your famotidine.  I appreciate the opportunity to care for you. Stan Head, MD, [facility]

## 2015-02-25 NOTE — Progress Notes (Signed)
  Referred by:Kristian Covey PAC with Joselyn Arrow, MD  Subjective:    Patient ID: Lisa Ayers, female    DOB: 09-Jan-1981, 34 y.o.   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 She was living in [place] about 9 years ago, had chronic recurrent UTI problems in used Macrobid regularly and also took anti-biotics  Frequently. When she was then she distinctly remembers a three-week period where she had nausea heartburn and couldn't eat. Subsequent to that she seemed to develop a lot of heartburn and belching. She manages this with intermittent tomes, and over-the-counter medications when she returned to the [place]. When she became pregnant 5 years ago she went on Nexium and took that for several years until December of last year at which point she weaned herself off of that and went on a strict dietary regimen of her reflux diet. For the most part she did well but if she eats citrus or tomato-based things, she will have increasing problems. If she eats pizza and has beer she will actually vomit. She does not have dysphagia problems. At this point she is using famotidine 20 mg daily frequently and sometimes on a second dose with what she says is good relief of symptoms. When she was on Nexium she was symptom-free. GI review of systems is otherwise negativenote she has about one caffeinated beverage daily. No tobacco. Medications, allergies, past medical history, past surgical history, family history and social history are reviewed and updated in the EMR.   Review of Systems As per history of present illness all other review of systems negative.    Objective:   Physical Exam  100/70 mmHg  Pulse 72  Ht  (1.676 m)  Wt 148 lb (67.132 kg)  BMI 23.90 kg/m2  LMP 02/06/2015 (Approximate)@  General:  NAD Eyes:   anicteric Lungs:  clear Heart:: S1S2 no rubs, murmurs or gallops Abdomen:    soft and nontender, BS+ Ext:   no edema, cyanosis or clubbing    Data Reviewed:  CBC shows a minimal anemia, improved from where it was, she does have a history of heavy periods. She is taking iron.     Assessment & Plan:       1. Gastroesophageal reflux disease without esophagitis   2. Mild anemia     We discussed the pathophysiology of gastric soft reflux disease.I think it's pretty clear that she has that. There is a potential relative indication for an EGD in her case though her age and sex and overall status do not indicate that she is at increased risk of Barrett's esophagus so I don't think is really necessary to do an EGD. She will try to control her symptoms with famotidine 20 mg twice a day to 3 times a day. PPI potential possible side effects and risks discussed, she could use that as well but if she is making out okay without significant symptoms on the H2 blocker I think that's fine. She will see me as needed. The slight anemia is almost certainly menstrual in origin based upon the overall history, and does not merit any GI workup in my opinion.  I appreciate the opportunity to care for this patient. CC: Kristian Covey [facility]

## 2015-08-19 MED FILL — FLUTICASONE PROP 50 MCG SPR: 50 | 60 days supply | Qty: 16 | Fill #0

## 2015-08-19 MED FILL — MONTELUKAST SOD 10 MG TAB: 10 | 90 days supply | Qty: 90 | Fill #1

## 2015-10-28 ENCOUNTER — Telehealth: Payer: Self-pay | Admitting: Internal Medicine

## 2015-10-28 MED ORDER — PANTOPRAZOLE SODIUM 20 MG PO TBEC: 20.0000 mg | DELAYED_RELEASE_TABLET | Freq: Every day | ORAL | Status: DC

## 2015-10-28 MED FILL — PANTOPRAZOLE SOD DR 20 MG T: 20 | 90 days supply | Qty: 90 | Fill #0

## 2015-10-28 NOTE — Telephone Encounter (Signed)
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  She is requesting rx for PPI .  Please advise

## 2015-10-28 NOTE — Telephone Encounter (Signed)
Patient notified

## 2015-10-28 NOTE — Telephone Encounter (Signed)
pantoprazole 20 mg qd before breakfast # 90 [id number] RF

## 2015-11-22 ENCOUNTER — Telehealth: Payer: Self-pay | Admitting: *Deleted

## 2015-11-22 NOTE — Telephone Encounter (Signed)
Please contact patient in regards to 08 recall. Patient is past due   08 ASCUS with neg HR HPV//no AEX scheduled//sp

## 2015-11-24 NOTE — Telephone Encounter (Signed)
Message left on voicemail (mobile) for patient to return call to schedule annual exam. [phone]

## 2015-11-28 NOTE — Telephone Encounter (Signed)
Message left on voicemail for patient to return call and schedule AEX.

## 2015-12-06 NOTE — Telephone Encounter (Signed)
Please send letter and remove from recall. 

## 2015-12-06 NOTE — Telephone Encounter (Signed)
Patient has not returned call or scheduled annual exam. 

## 2015-12-06 NOTE — Telephone Encounter (Signed)
Dr. Miller please see messages below and advise on recall status. Thanks Elaine  

## 2015-12-07 ENCOUNTER — Encounter: Payer: Self-pay | Admitting: *Deleted

## 2015-12-07 NOTE — Telephone Encounter (Signed)
A letter has been sent and she has been removed from recall -eh

## 2016-01-11 ENCOUNTER — Ambulatory Visit (INDEPENDENT_AMBULATORY_CARE_PROVIDER_SITE_OTHER): Payer: 59 | Admitting: Medical

## 2016-01-11 ENCOUNTER — Encounter: Payer: Self-pay | Admitting: Medical

## 2016-01-11 VITALS — BP 104/70 | HR 66 | Temp 99.6°F | Wt 163.0 lb

## 2016-01-11 DIAGNOSIS — K219 Gastro-esophageal reflux disease without esophagitis: Secondary | ICD-10-CM | POA: Diagnosis not present

## 2016-01-11 DIAGNOSIS — J988 Other specified respiratory disorders: Secondary | ICD-10-CM | POA: Diagnosis not present

## 2016-01-11 MED ORDER — PANTOPRAZOLE SODIUM 40 MG PO TBEC
DELAYED_RELEASE_TABLET | ORAL | 1 refills | Status: DC
Start: 2016-01-11 — End: 2016-03-14

## 2016-01-11 MED ORDER — AZITHROMYCIN 250 MG PO TABS: ORAL_TABLET | ORAL | 0 refills | Status: DC

## 2016-01-11 MED FILL — PANTOPRAZOLE SOD DR 40 MG T: 40 | 30 days supply | Qty: 30 | Fill #0 | Status: TO

## 2016-01-11 MED FILL — AZITHROMYCIN 250 MG TABLET: 250 | 5 days supply | Qty: 6 | Fill #0

## 2016-01-11 NOTE — Progress Notes (Signed)
Subjective:  Clearance CootsBonnie C Musselman is a 35 y.o. female who presents for respiratory illness.  Started 2 weeks ago with cough, head congestion, congestion in upper chest, getting up some mucous.  But in recent days, worse headache, sore throat, hoarseness, body aches.  Hasn't felt feverish.  Using Mucinex and Ibuprofen OTC.  Daughter has had the same.  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   No NVD, no abdominal or back pain.   No rash.   No sinus pressure.    Also wants refill on GERD medication.   Saw GI for this several months ago.  Has not seen improvement with H2 blockers, but with Protonix 1-2 tablet daily did see some improvement.  No other aggravating or relieving factors.  No other c/o.  Past Medical History:  Diagnosis Date  . Allergy   . Bilateral bunions   . Eczema   . GERD (gastroesophageal reflux disease)   . History of abnormal cervical Pap smear 2009, 2010, 2013  . Somatic dysfunction of spine, thoracic   . Thoracic back pain   . Urinary tract infection    several prior  . Wears contact lenses     ROS as in subjective   Objective: BP 104/70   Pulse 66   Temp 99.6 F (37.6 C) (Tympanic)   Wt 163 lb (73.9 kg)   LMP 12/31/2015   BMI 26.31 kg/m   General appearance: Alert, WD/WN, no distress, mildly ill appearing                             Skin: warm, no rash                           Head: no sinus tenderness                            Eyes: conjunctiva normal, corneas clear, PERRLA                            Ears: pearly TMs, external ear canals normal                          Nose: septum midline, turbinates swollen, with erythema and clear discharge             Mouth/throat: MMM, tongue normal, mild pharyngeal erythema                           Neck: supple, no adenopathy, no thyromegaly, non tender                          Heart: RRR, normal S1, S2, no murmurs                         Lungs: CTA bilaterally, no wheezes, rales,  or rhonchi     Assessment  Encounter Diagnoses  Name Primary?  Marland Kitchen. Respiratory tract infection Yes  . Gastroesophageal reflux disease without esophagitis       Plan: Begin zpak, can c/t Ibuprofen, Mucinex, rest, hydrate well.  Discuss supportive care  GERD - discussed risks/benefits of medication, restart Protonix, can c/t OTC H2 blocker.  If not improving  in the next 2- 3wk, consider f/u with GI.   Kendal HymenBonnie was seen today for cough.  Diagnoses and all orders for this visit:  Respiratory tract infection  Gastroesophageal reflux disease without esophagitis  Other orders -     azithromycin (ZITHROMAX) 250 MG tablet; 2 tablets day 1, then 1 tablet days 2-4 -     pantoprazole (PROTONIX) 40 MG tablet; 1 tablet daily po 45 min prior to breakfast

## 2016-02-02 ENCOUNTER — Ambulatory Visit (INDEPENDENT_AMBULATORY_CARE_PROVIDER_SITE_OTHER): Payer: 59 | Admitting: Medical

## 2016-02-02 ENCOUNTER — Encounter: Payer: Self-pay | Admitting: Medical

## 2016-02-02 VITALS — BP 112/70 | HR 64 | Temp 98.6°F | Resp 16 | Wt 165.8 lb

## 2016-02-02 DIAGNOSIS — Q742 Other congenital malformations of lower limb(s), including pelvic girdle: Secondary | ICD-10-CM

## 2016-02-02 DIAGNOSIS — R070 Pain in throat: Secondary | ICD-10-CM

## 2016-02-02 DIAGNOSIS — Q6689 Other  specified congenital deformities of feet: Secondary | ICD-10-CM

## 2016-02-02 DIAGNOSIS — J039 Acute tonsillitis, unspecified: Secondary | ICD-10-CM

## 2016-02-02 MED ORDER — LEVOFLOXACIN 500 MG PO TABS: 500.0000 mg | ORAL_TABLET | Freq: Every day | ORAL | 0 refills | Status: DC

## 2016-02-02 NOTE — Progress Notes (Signed)
Subjective:  Lisa Ayers is a 35 y.o. female who presents for right sore throat and toe concern.  I saw her a few weeks ago for respiratory infection and this resolved with Zpak.  She was on vacation last week did a lot of eating and draining.  In the past several days has pain in right throat, tonsil seems enlarged and has general discomfort in the right neck /throat muscles.   Pain is worse at night, better in the morning.  Still has some lingering cough, mostly in the morning.   No fever, no congestion, no headache, no ear pain.  Using some tylenol.  Doing some throat clearing  She notes in the last month has noted a fullness or swelling beneath left 3rd toe. Not painful, just curious about it.    No other aggravating or relieving factors.  No other c/o.  Past Medical History:  Diagnosis Date  . Allergy   . Bilateral bunions   . Eczema   . GERD (gastroesophageal reflux disease)   . History of abnormal cervical Pap smear 2009, 2010, 2013  . Somatic dysfunction of spine, thoracic   . Thoracic back pain   . Urinary tract infection    several prior  . Wears contact lenses    Current Outpatient Prescriptions on File Prior to Visit  Medication Sig Dispense Refill  . ferrous sulfate 325 (65 FE) MG tablet Take 325 mg by mouth daily with breakfast.    . pantoprazole (PROTONIX) 40 MG tablet 1 tablet daily po 45 min prior to breakfast 30 tablet 1  . azithromycin (ZITHROMAX) 250 MG tablet 2 tablets day 1, then 1 tablet days 2-4 (Patient not taking: Reported on 02/02/2016) 6 tablet 0  . fluticasone (FLONASE) 50 MCG/ACT nasal spray Place 1 spray into both nostrils daily. (Patient not taking: Reported on 01/11/2016) 16 g 11  . montelukast (SINGULAIR) 10 MG tablet Take 1 tablet (10 mg total) by mouth at bedtime. (Patient not taking: Reported on 02/02/2016) 90 tablet 3   No current facility-administered medications on file prior to visit.     ROS as in subjective   Objective: BP 112/70    Pulse 64   Temp 98.6 F (37 C) (Oral)   Resp 16   Wt 165 lb 12.8 oz (75.2 kg)   LMP 12/31/2015   BMI 26.76 kg/m   General appearance: Alert, WD/WN, no distress                             Skin: warm, no rash                           Head: no sinus tenderness                            Eyes: conjunctiva normal, corneas clear, PERRLA                            Ears: pearly TMs, external ear canals normal                          Nose: septum midline, turbinates normal without erythema and no discharge             Mouth/throat: MMM, tongue normal, no erythema.   bilat tonsils seem  tense somewhat swollen, but no exudate, no erythema, no abscess, not particularly infected appearing.   2+ tonsils bilat.                             Neck: supple, no adenopathy, no thyromegaly, non tender                          Heart: RRR, normal S1, S2, no murmurs                         Lungs: CTA bilaterally, no wheezes, rales, or rhonchi Left 3rd toe volar surface of proximal phalanx with fullness, seems to be a small somewhat mobile mass, suggestive of ganglion cyst.       Assessment  Encounter Diagnoses  Name Primary?  . Acute tonsillitis, unspecified etiology Yes  . Throat discomfort   . Toe anomaly       Plan: Tonsil inflammation and localized discomfort vs infection.   Advised for the next several days use OTC Ibuprofen 2-3 times daily, OTC antihistamine, salt water gargles, warm fluids.   If not improving at all by weekend, begin Levaquin. otherwise should resolve with time.  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  Will use watch and wait approach.   If enlarging or worsening, call back and we will refer to podiatry.   Lisa Ayers was seen today for sore throat.  Diagnoses and all orders for this visit:  Acute tonsillitis, unspecified etiology  Throat discomfort  Toe anomaly  Other orders -     levofloxacin (LEVAQUIN) 500 MG tablet; Take 1 tablet (500 mg total) by mouth daily.

## 2016-02-06 ENCOUNTER — Ambulatory Visit (INDEPENDENT_AMBULATORY_CARE_PROVIDER_SITE_OTHER): Payer: 59 | Admitting: Nurse Practitioner

## 2016-02-06 ENCOUNTER — Encounter: Payer: Self-pay | Admitting: Nurse Practitioner

## 2016-02-06 VITALS — BP 116/74 | HR 60 | Ht 66.25 in | Wt 163.0 lb

## 2016-02-06 DIAGNOSIS — Z124 Encounter for screening for malignant neoplasm of cervix: Secondary | ICD-10-CM | POA: Diagnosis not present

## 2016-02-06 DIAGNOSIS — Z0000 Encounter for general adult medical examination without abnormal findings: Secondary | ICD-10-CM

## 2016-02-06 DIAGNOSIS — Z01419 Encounter for gynecological examination (general) (routine) without abnormal findings: Secondary | ICD-10-CM | POA: Diagnosis not present

## 2016-02-06 DIAGNOSIS — R87610 Atypical squamous cells of undetermined significance on cytologic smear of cervix (ASC-US): Secondary | ICD-10-CM | POA: Diagnosis not present

## 2016-02-06 NOTE — Patient Instructions (Signed)

## 2016-02-06 NOTE — Progress Notes (Signed)
Patient ID: Lisa Ayers, female   DOB: 08/19/1980, 35 y.o.   MRN: [id number]  35 y.o. [id number] Married  Caucasian Fe here for annual exam.  Now menses at 5 days. Flow heavier X 2 days.. Super tampon changing every 4 hrs. Some cramps, some PMS.  Daughter started kindergarten this year.  Patient's last menstrual period was 12/31/2015.          Sexually active: Yes.    The current method of family planning is IUD. ParaGard inserted 12/20/10 Exercising: Yes.    cardio and weights 3-5 times per week Smoker:  no  Health Maintenance: Pap:  08/11/13, negative with neg HR HPV, colpo 06/04/12 LGSIL TDaP:  2010 HIV: 2012 with pregnancy  Labs: HB: 12.0  Urine: unable to void   reports that she has never smoked. She has never used smokeless tobacco. She reports that she drinks about 1.8 oz of alcohol per week . She reports that she does not use drugs.  Past Medical History:  Diagnosis Date  . Allergy   . Bilateral bunions   . Eczema   . GERD (gastroesophageal reflux disease)   . History of abnormal cervical Pap smear 2009, 2010, 2013  . Somatic dysfunction of spine, thoracic   . Thoracic back pain   . Urinary tract infection    several prior  . Wears contact lenses     Past Surgical History:  Procedure Laterality Date  . COLPOSCOPY    . INTRAUTERINE DEVICE INSERTION  12/20/10   Paraguard    Current Outpatient Prescriptions  Medication Sig Dispense Refill  . azithromycin (ZITHROMAX) 250 MG tablet 2 tablets day 1, then 1 tablet days 2-4 (Patient not taking: Reported on 02/02/2016) 6 tablet 0  . ferrous sulfate 325 (65 FE) MG tablet Take 325 mg by mouth daily with breakfast.    . fluticasone (FLONASE) 50 MCG/ACT nasal spray Place 1 spray into both nostrils daily. (Patient not taking: Reported on 01/11/2016) 16 g 11  . levofloxacin (LEVAQUIN) 500 MG tablet Take 1 tablet (500 mg total) by mouth daily. 7 tablet 0  . montelukast (SINGULAIR) 10 MG tablet Take 1 tablet (10 mg total) by mouth at  bedtime. (Patient not taking: Reported on 02/02/2016) 90 tablet 3  . pantoprazole (PROTONIX) 40 MG tablet 1 tablet daily po 45 min prior to breakfast 30 tablet 1   No current facility-administered medications for this visit.     Family History  Problem Relation Age of Onset  . Depression Mother   . Diabetes Mother   . Thyroid disease Mother   . Glaucoma Mother   . Diabetes Father   . Diabetes Maternal Grandmother   . Stroke Maternal Grandmother   . Heart disease Maternal Grandmother     CABG  . Breast cancer Paternal Grandmother [id number]       . COPD Paternal Grandmother     ROS:  Pertinent items are noted in HPI.  Otherwise, a comprehensive ROS was negative.  Exam:   LMP 12/31/2015    Ht Readings from Last 3 Encounters:  02/25/15 5\' 6"  (1.676 m)  01/19/15 5\' 7"  (1.702 m)  09/14/14 5\' 6"  (1.676 m)    General appearance: alert, cooperative and appears stated age Head: Normocephalic, without obvious abnormality, atraumatic Neck: no adenopathy, supple, symmetrical, trachea midline and thyroid normal to inspection and palpation Lungs: clear to auscultation bilaterally Breasts: normal appearance, no masses or tenderness Heart: regular rate and rhythm Abdomen: soft, non-tender; no masses,  no organomegaly  Extremities: extremities normal, atraumatic, no cyanosis or edema Skin: Skin color, texture, turgor normal. No rashes or lesions Lymph nodes: Cervical, supraclavicular, and axillary nodes normal. No abnormal inguinal nodes palpated Neurologic: Grossly normal   Pelvic: External genitalia:  no lesions              Urethra:  normal appearing urethra with no masses, tenderness or lesions              Bartholin's and Skene's: normal                 Vagina: normal appearing vagina with normal color and discharge, no lesions              Cervix: anteverted - IUD strings are visible              Pap taken: Yes.   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

## 2016-02-08 LAB — IPS PAP TEST WITH HPV

## 2016-02-12 NOTE — Progress Notes (Signed)
Encounter reviewed by Dr. Janean SarkBrook Amundson C. Silva. Pap done 09/14/14 - ASCUS, blood and endometrial cells, reactive squamous cells, negative HR HPV.  May need colposcopy depending on this pap.

## 2016-02-13 MED FILL — PANTOPRAZOLE SOD DR 40 MG T: 40 | 30 days supply | Qty: 30 | Fill #0

## 2016-02-17 ENCOUNTER — Telehealth: Payer: Self-pay

## 2016-02-17 DIAGNOSIS — R87610 Atypical squamous cells of undetermined significance on cytologic smear of cervix (ASC-US): Secondary | ICD-10-CM

## 2016-02-17 NOTE — Telephone Encounter (Signed)
Left message to call Nashae Maudlin at [phone]. 

## 2016-02-17 NOTE — Telephone Encounter (Addendum)
-----   Message ----- From: Ria CommentPatricia Grubb, FNP Sent: 02/12/2016   5:18 PM To: Jannet AskewKaitlyn E Hines, RN Subject: FW: patient needs colposcopy                   Per Dr. Edward JollySilva this pt needs a colpo biopsy.  Can you discuss and get scheduled. ----- Message ----- From: Patton SallesBrook E Amundson C Silva, MD Sent: 02/12/2016   8:59 AM To: Ria CommentPatricia Grubb, FNP Subject: patient needs colposcopy                       Hi Patty,   I am recommending colposcopy for your patient.   She has a hx of LGSIL and had a pap in 2016 ASCUS, neg HR HPV and her pap now is ASCUS, neg HR HPV.   She probably should have had a colposcopy after her pap in 2016.   Thanks,   [employer]

## 2016-03-09 NOTE — Telephone Encounter (Signed)
Left message to call Kaitlyn at [phone]. 

## 2016-03-09 NOTE — Telephone Encounter (Signed)
Patient returning call.

## 2016-03-12 NOTE — Telephone Encounter (Signed)
Left message to call Lisa Ayers at [phone]. 

## 2016-03-12 NOTE — Telephone Encounter (Signed)
Spoke with patient. Advised of message as seen below from Dr.Silva. Patient is agreeable and verbalizes understanding.  Patient has a Paragard IUD in place. Colposcopy scheduled for 03/30/2016 at 3 pm with Dr.Silva. Patient is agreeable to date and time. Instructions given. Motrin 800 mg po x , one hour before appointment with food. Make sure to eat a meal before appointment and drink plenty of fluids. Patient verbalized understanding and will call to reschedule if will be on menses or has any concerns regarding pregnancy Patient agreeable and verbalized understanding of all instructions. Order placed for precert.  Routing to provider for final review. Patient agreeable to disposition. Will close encounter.

## 2016-03-14 ENCOUNTER — Other Ambulatory Visit: Payer: Self-pay | Admitting: Medical

## 2016-03-14 MED FILL — PANTOPRAZOLE SOD DR 40 MG T: 40 | 30 days supply | Qty: 30 | Fill #0

## 2016-03-30 ENCOUNTER — Ambulatory Visit (INDEPENDENT_AMBULATORY_CARE_PROVIDER_SITE_OTHER): Payer: 59 | Admitting: Obstetrics and Gynecology

## 2016-03-30 VITALS — BP 100/70 | HR 66 | Ht 66.25 in | Wt 163.2 lb

## 2016-03-30 DIAGNOSIS — N870 Mild cervical dysplasia: Secondary | ICD-10-CM | POA: Diagnosis not present

## 2016-03-30 DIAGNOSIS — R87610 Atypical squamous cells of undetermined significance on cytologic smear of cervix (ASC-US): Secondary | ICD-10-CM

## 2016-03-30 DIAGNOSIS — N72 Inflammatory disease of cervix uteri: Secondary | ICD-10-CM | POA: Diagnosis not present

## 2016-03-30 DIAGNOSIS — Z01812 Encounter for preprocedural laboratory examination: Secondary | ICD-10-CM | POA: Diagnosis not present

## 2016-03-30 LAB — POCT URINE PREGNANCY: Preg Test, Ur: NEGATIVE

## 2016-03-30 NOTE — Patient Instructions (Signed)

## 2016-03-30 NOTE — Progress Notes (Signed)
Subjective:     Patient ID: Lisa Ayers, female   DOB: 11/02/1980, 35 y.o.   MRN: [id number]  HPI  Patient here today for colposcopy. Pap smear 02-06-16 Ascus:Neg HR HPV. Pap history as follow  09-14-14 Ascus:Neg HR HPV 08-11-13 Neg:Neg HR HPV 06-04-12  LGSIL on colposcopy 07/23/08 LGSIL on colposcopic biopsy.   Review of Systems  LMP: 03-13-16 Contraception: Paragard IUD inserted 12-20-10 UPT: Negative     Objective:   Physical Exam  Genitourinary:     Colposcopy Consent for procedure.  Speculum placed in vagina.  3% acetic acid used.  Colposcopy satisfactory.  IUD stings noted and respected during procedure.  [place] m[place] noted at 12:00 and 7:00.  ECC performed.  Tenaculum placed on anterior cervix to assist with biopsy at 12:00. Biopsy of exocervix at 12:00 and 7:00.  All sent to pathology separately. Monsel's placed.  Instruments removed. Minimal EBL. No complications.  IUD maintained in position.     Assessment:        ASCUS, negative HR HPV paps x 2.  Prior hx of LGSIL x 2 colposcopies.   (Paper chart in storage.) Signs of HPV today.  ParaGard IUD patient.   Plan:     Discussion of paps, HPV, colposcopy   Follow up biopsies.  At a minimum, will do cotesting in one year.   __15_____ minutes face to face time of which over 50% was spent in counseling.   After visit summary to patient.

## 2016-04-02 ENCOUNTER — Telehealth: Payer: Self-pay | Admitting: Obstetrics and Gynecology

## 2016-04-02 ENCOUNTER — Encounter: Payer: Self-pay | Admitting: Obstetrics and Gynecology

## 2016-04-02 ENCOUNTER — Ambulatory Visit (INDEPENDENT_AMBULATORY_CARE_PROVIDER_SITE_OTHER): Payer: 59 | Admitting: Obstetrics and Gynecology

## 2016-04-02 VITALS — BP 112/72 | HR 60 | Ht 66.25 in | Wt 163.0 lb

## 2016-04-02 DIAGNOSIS — N760 Acute vaginitis: Secondary | ICD-10-CM

## 2016-04-02 MED ORDER — NYSTATIN-TRIAMCINOLONE 100000-0.1 UNIT/GM-% EX CREA: 1.0000 "application " | TOPICAL_CREAM | Freq: Two times a day (BID) | CUTANEOUS | 0 refills | Status: DC

## 2016-04-02 MED FILL — NYSTATIN/TRIAMCINOLONE CRM: 100000-0.1 | 15 days supply | Qty: 60 | Fill #0

## 2016-04-02 NOTE — Telephone Encounter (Signed)
Please give appt time for 1:00 today with me.

## 2016-04-02 NOTE — Telephone Encounter (Signed)
Spoke with patient. Appointment scheduled for today 04/02/2016 at 1 pm with Dr.Silva. Patient is agreeable to date and time.  Routing to provider for final review. Patient agreeable to disposition. Will close encounter.

## 2016-04-02 NOTE — Telephone Encounter (Signed)
Patient had a procedure on Friday and she thinks she may have an infection. She wants to discuss this with the nurse.

## 2016-04-02 NOTE — Progress Notes (Signed)
GYNECOLOGY  VISIT   HPI: 35 y.o.   Married  Caucasian  female   G1P1001 with Patient's last menstrual period was 03/13/2016 (exact date).   here for internal vaginal itching. Patient took 1 Diflucan on 03-31-16 and a 1 day Monistat on 04-01-16 in the AM.  Symptoms are some better but not totally resolved. She is 3 days status post colposcopy.  Notes symptoms are mostly in the labial region.  Took one expired Diflucan and this did not help.  Used Monistat ovule and external cream and this did not help.   Having brownish drainage since colpo.  GYNECOLOGIC HISTORY: Patient's last menstrual period was 03/13/2016 (exact date). Contraception:  Paragard IUD inserted 12-20-10 Menopausal hormone therapy:  none Last mammogram:  n/a Last pap smear:   02-06-16 Ascus:Neg HR HPV        OB History    Gravida Para Term Preterm AB Living   1 1 1  0 0 1   SAB TAB Ectopic Multiple Live Births   0 0 0 0 1         Patient Active Problem List   Diagnosis Date Noted  . Annual physical exam 01/19/2015  . Gastroesophageal reflux disease without esophagitis 01/19/2015  . Rhinitis, allergic 01/19/2015  . Other back pain 01/19/2015  . Eczema 04/25/2011    Past Medical History:  Diagnosis Date  . Allergy   . Bilateral bunions   . Eczema   . GERD (gastroesophageal reflux disease)   . History of abnormal cervical Pap smear 2009, 2010, 2013  . Somatic dysfunction of spine, thoracic   . Thoracic back pain   . Urinary tract infection    several prior  . Wears contact lenses     Past Surgical History:  Procedure Laterality Date  . COLPOSCOPY    . INTRAUTERINE DEVICE INSERTION  12/20/10   Paraguard    Current Outpatient Prescriptions  Medication Sig Dispense Refill  . ferrous sulfate 325 (65 FE) MG tablet Take 325 mg by mouth daily with breakfast.    . fluticasone (FLONASE) 50 MCG/ACT nasal spray Place 1 spray into both nostrils daily. 16 g 11  . montelukast (SINGULAIR) 10 MG tablet Take 1 tablet  (10 mg total) by mouth at bedtime. 90 tablet 3  . pantoprazole (PROTONIX) 40 MG tablet TAKE 1 TABLET DAILY BY MOUTH 45 MINUTES PRIOR TO BREAKFAST 30 tablet 1   No current facility-administered medications for this visit.      ALLERGIES: Augmentin [amoxicillin-pot clavulanate] and Sulfur  Family History  Problem Relation Age of Onset  . Depression Mother   . Diabetes Mother   . Thyroid disease Mother   . Glaucoma Mother   . Diabetes Father   . Diabetes Maternal Grandmother   . Stroke Maternal Grandmother   . Heart disease Maternal Grandmother     CABG  . Breast cancer Paternal Grandmother [id number]       . COPD Paternal Grandmother     Social History   Social History  . Marital status: Married    Spouse name: N/A  . Number of children: 1  . Years of education: N/A   Occupational History  . speech pathologist [employer]   Social History Main Topics  . Smoking status: Never Smoker  . Smokeless tobacco: Never Used  . Alcohol use 1.8 oz/week    1 Cans of beer, 2 Standard drinks or equivalent per week  . Drug use: No  . Sexual activity: Yes  Partners: Male    Birth control/ protection: IUD   Other Topics Concern  . Not on file   Social History Narrative   Lives with herhusband and her daughter Golden HurterKatie Mae, 1 dog.  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  Runs, likes to read.  Agnostic.      ROS:  Pertinent items are noted in HPI.  PHYSICAL EXAMINATION:    BP 112/72 (BP Location: Right Arm, Patient Position: Sitting, Cuff Size: Normal)   Pulse 60   Ht 5' 6.25" (1.683 m)   Wt 163 lb (73.9 kg)   LMP 03/13/2016 (Exact Date)   BMI 26.11 kg/m     General appearance: alert, cooperative and appears stated age    Pelvic: External genitalia:  Perineum with bilateral areas of erythema.                Urethra:  normal appearing urethra with no masses, tenderness or lesions              Bartholins and Skenes: normal                 Vagina: normal appearing vagina with normal  color and discharge, no lesions              Cervix: consistent with colposcopic biopsy.  Yellow drainage consistent with Monsel's.                 Bimanual Exam:  Uterus:  normal size, contour, position, consistency, mobility, non-tender              Adnexa: no mass, fullness, tenderness     Chaperone was present for exam.  ASSESSMENT  Status post colposcopy.  Vulvovaginitis.   PLAN  Affirm done.  If needs tx for BV, will prescribe Flagyl.  Discussed ETOh precautions.  Mycolog II.  Office will call with biopsy results.    An After Visit Summary was printed and given to the patient.  ___15___ minutes face to face time of which over 50% was spent in counseling.

## 2016-04-02 NOTE — Telephone Encounter (Signed)
Spoke with patient. Patient had a colposcopy on 03/30/2016 with Dr.Silva. Reports on Saturday 03/31/2016 she began to have internal and external vaginal itching. Reports she has been having brown discharge from her colposcopy. Advised discharge following her colposcopy is normal. Denies any odor to the discharge, fever, chills, or bleeding. Reports she took 1 Diflucan on 03/31/2016 without relief. Used Monistat 1 on 04/01/2016 in the morning. Woke up this morning and is still having increased vaginal itching. Advised she will need to be seen in the office for further evaluation with Dr.Silva. Advised I will review with Dr.Silva regarding scheduling and return call. Patient is agreeable.  Dr.Silva, please advise regarding scheduling as there are no current openings today.

## 2016-04-03 LAB — WET PREP BY MOLECULAR PROBE
Candida species: NEGATIVE
Gardnerella vaginalis: NEGATIVE
Trichomonas vaginosis: NEGATIVE

## 2016-04-05 LAB — IPS OTHER TISSUE BIOPSY

## 2016-04-06 ENCOUNTER — Encounter: Payer: Self-pay | Admitting: Obstetrics and Gynecology

## 2016-04-17 MED FILL — PANTOPRAZOLE SOD DR 40 MG T: 40 | 30 days supply | Qty: 30 | Fill #1 | Status: TO

## 2016-05-04 ENCOUNTER — Ambulatory Visit: Payer: 59 | Admitting: Family Medicine

## 2016-05-17 ENCOUNTER — Other Ambulatory Visit: Payer: Self-pay | Admitting: Medical

## 2016-05-17 MED FILL — PANTOPRAZOLE SOD DR 40 MG T: 40 | 30 days supply | Qty: 30 | Fill #0

## 2016-06-22 MED FILL — PANTOPRAZOLE SOD DR 40 MG T: 40 | 30 days supply | Qty: 30 | Fill #1 | Status: TO

## 2016-07-23 ENCOUNTER — Other Ambulatory Visit: Payer: Self-pay | Admitting: Medical

## 2016-07-24 MED FILL — PANTOPRAZOLE SOD DR 40 MG T: 40 | 30 days supply | Qty: 30 | Fill #0

## 2016-07-31 ENCOUNTER — Ambulatory Visit: Payer: 59 | Admitting: Podiatry

## 2016-08-14 DIAGNOSIS — H52223 Regular astigmatism, bilateral: Secondary | ICD-10-CM | POA: Diagnosis not present

## 2016-08-14 DIAGNOSIS — H5213 Myopia, bilateral: Secondary | ICD-10-CM | POA: Diagnosis not present

## 2016-08-21 ENCOUNTER — Other Ambulatory Visit: Payer: Self-pay | Admitting: Medical

## 2016-08-21 MED FILL — PANTOPRAZOLE SOD DR 40 MG T: 40 | 30 days supply | Qty: 30 | Fill #0

## 2016-09-24 MED FILL — PANTOPRAZOLE SOD DR 40 MG T: 40 | 30 days supply | Qty: 30 | Fill #1

## 2016-10-24 MED FILL — PANTOPRAZOLE SOD DR 40 MG T: 40 | 30 days supply | Qty: 30 | Fill #2

## 2016-11-20 DIAGNOSIS — M2042 Other hammer toe(s) (acquired), left foot: Secondary | ICD-10-CM | POA: Diagnosis not present

## 2016-11-20 DIAGNOSIS — M21612 Bunion of left foot: Secondary | ICD-10-CM | POA: Diagnosis not present

## 2016-11-20 DIAGNOSIS — B351 Tinea unguium: Secondary | ICD-10-CM | POA: Diagnosis not present

## 2016-11-20 DIAGNOSIS — L602 Onychogryphosis: Secondary | ICD-10-CM | POA: Diagnosis not present

## 2016-11-20 DIAGNOSIS — M2041 Other hammer toe(s) (acquired), right foot: Secondary | ICD-10-CM | POA: Diagnosis not present

## 2016-11-20 DIAGNOSIS — L603 Nail dystrophy: Secondary | ICD-10-CM | POA: Diagnosis not present

## 2016-11-22 MED FILL — PANTOPRAZOLE SOD DR 40 MG T: 40 | 30 days supply | Qty: 30 | Fill #3

## 2016-12-11 DIAGNOSIS — M21612 Bunion of left foot: Secondary | ICD-10-CM | POA: Diagnosis not present

## 2016-12-11 DIAGNOSIS — B351 Tinea unguium: Secondary | ICD-10-CM | POA: Diagnosis not present

## 2016-12-11 DIAGNOSIS — M21611 Bunion of right foot: Secondary | ICD-10-CM | POA: Diagnosis not present

## 2016-12-25 MED FILL — PANTOPRAZOLE SOD DR 40 MG T: 40 | 30 days supply | Qty: 30 | Fill #4

## 2017-01-29 ENCOUNTER — Other Ambulatory Visit: Payer: Self-pay | Admitting: Medical

## 2017-01-29 MED FILL — PANTOPRAZOLE SOD DR 40 MG T: 40 | 30 days supply | Qty: 30 | Fill #0

## 2017-02-14 ENCOUNTER — Ambulatory Visit (INDEPENDENT_AMBULATORY_CARE_PROVIDER_SITE_OTHER): Payer: 59 | Admitting: Certified Nurse Midwife

## 2017-02-14 ENCOUNTER — Other Ambulatory Visit (HOSPITAL_COMMUNITY)
Admission: RE | Admit: 2017-02-14 | Discharge: 2017-02-14 | Disposition: A | Discharge status: Home / Self Care | Payer: 59 | Source: Ambulatory Visit | Attending: Certified Nurse Midwife | Admitting: Certified Nurse Midwife

## 2017-02-14 ENCOUNTER — Encounter: Payer: Self-pay | Admitting: Certified Nurse Midwife

## 2017-02-14 VITALS — BP 110/62 | HR 68 | Resp 16 | Ht 66.25 in | Wt 166.0 lb

## 2017-02-14 DIAGNOSIS — N938 Other specified abnormal uterine and vaginal bleeding: Secondary | ICD-10-CM

## 2017-02-14 DIAGNOSIS — Z124 Encounter for screening for malignant neoplasm of cervix: Secondary | ICD-10-CM | POA: Diagnosis not present

## 2017-02-14 DIAGNOSIS — Z30431 Encounter for routine checking of intrauterine contraceptive device: Secondary | ICD-10-CM

## 2017-02-14 DIAGNOSIS — Z01419 Encounter for gynecological examination (general) (routine) without abnormal findings: Secondary | ICD-10-CM

## 2017-02-14 NOTE — Patient Instructions (Signed)

## 2017-02-14 NOTE — Progress Notes (Signed)
36 y.o. G1P1001 same partner 8 years,  Caucasian Fe here for annual exam. Contraception Paragard IUD, working well. Periods normal, for past 6 months has noted some brown discharge 2-3 days prior to onset and after period like bleeding, will stop and 2-3 days after, may have red bleeding again for 2-3 days and stop. No cramping. No vaginal issues or irritation. Sees PCP for aex/labs/GERD medication and inhaler. Has appointment tomorrow. No STD concerns. 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 No other health concerns today.  Patient's last menstrual period was 01/24/2017 (exact date).          Sexually active: Yes.    The current method of family planning is IUD.    Exercising: Yes.    hot yoga, tennis, strength, cardio classes Smoker:  no  Health Maintenance: Pap:  09-14-14 ASCUS with HPV HR neg, 02-06-16 ASCUS HPV HR neg colpo LSIL History of Abnormal Pap: yes, colpo 11/17 MMG:  none Self Breast exams: no Colonoscopy:  none BMD:   none TDaP:  2010 Shingles: no Pneumonia: no Hep C and HIV: HIV neg with pregnancy Labs: no   reports that she has never smoked. She has never used smokeless tobacco. She reports that she drinks alcohol. She reports that she does not use drugs.  Past Medical History:  Diagnosis Date  . Allergy   . Bilateral bunions   . Dysplasia of cervix, low grade (CIN 1)    03/2016  . Eczema   . GERD (gastroesophageal reflux disease)   . History of abnormal cervical Pap smear 2009, 2010, 2013  . Somatic dysfunction of spine, thoracic   . Thoracic back pain   . Urinary tract infection    several prior  . Wears contact lenses     Past Surgical History:  Procedure Laterality Date  . COLPOSCOPY    . INTRAUTERINE DEVICE INSERTION  12/20/10   Paraguard    Current Outpatient Prescriptions  Medication Sig Dispense Refill  . fluticasone (FLONASE) 50 MCG/ACT nasal spray Place 1 spray into both nostrils daily. 16 g 11  . pantoprazole (PROTONIX) 40 MG tablet Take 40 mg  by mouth daily.    Marland Kitchen PARAGARD INTRAUTERINE COPPER IU by Intrauterine route.     No current facility-administered medications for this visit.     Family History  Problem Relation Age of Onset  . Depression Mother   . Diabetes Mother   . Thyroid disease Mother   . Glaucoma Mother   . Diabetes Father   . Diabetes Maternal Grandmother   . Stroke Maternal Grandmother   . Heart disease Maternal Grandmother        CABG  . Breast cancer Paternal Grandmother 64          . COPD Paternal Grandmother     ROS:  Pertinent items are noted in HPI.  Otherwise, a comprehensive ROS was negative.  Exam:   BP 110/62   Pulse 68   Resp 16   Ht 5' 6.25" (1.683 m)   Wt 166 lb (75.3 kg)   LMP 01/24/2017 (Exact Date)   BMI 26.59 kg/m  Height: 5' 6.25" (168.3 cm) Ht Readings from Last 3 Encounters:  02/14/17 5' 6.25" (1.683 m)  04/02/16 5' 6.25" (1.683 m)  03/30/16 5' 6.25" (1.683 m)    General appearance: alert, cooperative and appears stated age Head: Normocephalic, without obvious abnormality, atraumatic Neck: no adenopathy, supple, symmetrical, trachea midline and thyroid normal to inspection and palpation Lungs: clear to auscultation bilaterally Breasts: normal  appearance, no masses or tenderness, No nipple retraction or dimpling, No nipple discharge or bleeding, No axillary or supraclavicular adenopathy Heart: regular rate and rhythm Abdomen: soft, non-tender; no masses,  no organomegaly Extremities: extremities normal, atraumatic, no cyanosis or edema Skin: Skin color, texture, turgor normal. No rashes or lesions Lymph nodes: Cervical, supraclavicular, and axillary nodes normal. No abnormal inguinal nodes palpated Neurologic: Grossly normal   Pelvic: External genitalia:  no lesions              Urethra:  normal appearing urethra with no masses, tenderness or lesions              Bartholin's and Skene's: normal                 Vagina: normal appearing vagina with normal color and  discharge, no lesions              Cervix: no bleeding following Pap, no cervical motion tenderness, no lesions and IUD string noted in cervix, no bleeding  noted              Pap taken: yes Bimanual Exam:  Uterus:  normal size, contour, position, consistency, mobility, non-tender and tilts to left, slight globular feel, unable to feel left adnexa              Adnexa: normal adnexa and no mass, fullness, tenderness               Rectovaginal: Confirms               Anus:  normal sphincter tone, no lesions  Chaperone present: yes  A:  Well Woman with normal exam  Contraception Paragard IUD  DUB with IUD  History of ASCUS pap x 2 with colpo with LSIL noted, repeat pap today  GERD management with PCP  P:   Reviewed health and wellness pertinent to exam  Warning signs of IUD given  Discussed pelvic finding and with spotting for 6 months feel PUS warranted to make sure no other issues, such as fibroids. Patient agreeable. Patient will be called with Insurance info and scheduled.  Pap smear: yes   counseled on breast self exam, adequate intake of calcium and vitamin D, diet and exercise  return annually or prn  An After Visit Summary was printed and given to the patient.

## 2017-02-15 ENCOUNTER — Ambulatory Visit (INDEPENDENT_AMBULATORY_CARE_PROVIDER_SITE_OTHER): Payer: 59 | Admitting: Medical

## 2017-02-15 ENCOUNTER — Encounter: Payer: Self-pay | Admitting: Medical

## 2017-02-15 VITALS — BP 108/68 | HR 83 | Wt 165.6 lb

## 2017-02-15 DIAGNOSIS — H539 Unspecified visual disturbance: Secondary | ICD-10-CM | POA: Diagnosis not present

## 2017-02-15 DIAGNOSIS — K219 Gastro-esophageal reflux disease without esophagitis: Secondary | ICD-10-CM | POA: Diagnosis not present

## 2017-02-15 DIAGNOSIS — J301 Allergic rhinitis due to pollen: Secondary | ICD-10-CM | POA: Diagnosis not present

## 2017-02-15 DIAGNOSIS — Z862 Personal history of diseases of the blood and blood-forming organs and certain disorders involving the immune mechanism: Secondary | ICD-10-CM

## 2017-02-15 LAB — CYTOLOGY - PAP
Diagnosis: NEGATIVE
HPV: NOT DETECTED

## 2017-02-15 MED ORDER — PANTOPRAZOLE SODIUM 40 MG PO TBEC: 40.0000 mg | DELAYED_RELEASE_TABLET | Freq: Every day | ORAL | 3 refills | Status: DC

## 2017-02-15 MED ORDER — FLUTICASONE PROPIONATE 50 MCG/ACT NA SUSP: 1.0000 | Freq: Every day | NASAL | 11 refills | Status: DC

## 2017-02-15 NOTE — Progress Notes (Signed)
Subjective: Chief Complaint  Patient presents with  . med check    med check ,labs    Here for med check.   Doing well in general.   Just saw gyn recent for routine pap and f/u.  Saw Dr. Leone Payor this past year for consult about GERD, long term use of PPI.  Does fine on current medications, Flonase and Protonix.   Taking OTC slow release iron and vitamin C and probiotic.  Has been anemic in the past.  Didn't tolerate regular iron in the past.   In general, gets achy at times, hip pains, stopped running and doing other forms of exercise currently.  No hx/o blood transfusion.  She will get flu shot at work.  She reports a few episodes in recent month of a floater in her visual field that can gradually resole and drift out of the visual field over an hour or so.  Sometimes gets headaches at the same time but not always.   No numbness, no tingling, no weakness, no fever, no ataxia, no slurred speech.  No other aggravating or relieving factors. No other complaint.   Past Medical History:  Diagnosis Date  . Allergy   . Bilateral bunions   . Dysplasia of cervix, low grade (CIN 1)    03/2016  . Eczema   . GERD (gastroesophageal reflux disease)   . History of abnormal cervical Pap smear 2009, 2010, 2013  . Somatic dysfunction of spine, thoracic   . Thoracic back pain   . Urinary tract infection    several prior  . Wears contact lenses    Current Outpatient Prescriptions on File Prior to Visit  Medication Sig Dispense Refill  . PARAGARD INTRAUTERINE COPPER IU by Intrauterine route.     No current facility-administered medications on file prior to visit.    ROS as in subjective   Objective: BP 108/68   Pulse 83   Wt 165 lb 9.6 oz (75.1 kg)   LMP 01/24/2017 (Exact Date)   SpO2 99%   BMI 26.53 kg/m   General appearance: alert, no distress, WD/WN,  HEENT: normocephalic, sclerae anicteric, PERRLA, EOMi, nares patent, no discharge or erythema, pharynx normal Oral cavity: MMM, no  lesions Neck: supple, no lymphadenopathy, no thyromegaly, no masses, no bruits Heart: RRR, normal S1, S2, no murmurs Lungs: CTA bilaterally, no wheezes, rhonchi, or rales Extremities: no edema, no cyanosis, no clubbing Pulses: 2+ symmetric, upper and lower extremities, normal cap refill Neurological: alert, oriented x 3, CN2-12 intact, strength normal upper extremities and lower extremities, sensation normal throughout, DTRs 2+ throughout, no cerebellar signs, gait normal Psychiatric: normal affect, behavior normal, pleasant  Normal visual fields    Assessment: Encounter Diagnoses  Name Primary?  . Gastroesophageal reflux disease without esophagitis Yes  . Allergic rhinitis due to pollen, unspecified seasonality   . Vision disturbance   . History of anemia     Plan: She notes GI consult last year, advised to c/t PPI.    C/t same [employer] today Consider differential with visual concerns.   She last saw eye doctor 27mo ago, normal eval.   She will get flu shot at work.   Lisa Ayers was seen today for med check.  Diagnoses and all orders for this visit:  Gastroesophageal reflux disease without esophagitis -     CBC with Differential/Platelet -     TSH -     Basic metabolic panel -     Iron and TIBC -  Folate -     Vitamin B12 -     Ferritin  Allergic rhinitis due to pollen, unspecified seasonality -     CBC with Differential/Platelet -     TSH -     Basic metabolic panel -     Iron and TIBC -     Folate -     Vitamin B12 -     Ferritin  Vision disturbance -     CBC with Differential/Platelet -     TSH -     Basic metabolic panel -     Iron and TIBC -     Folate -     Vitamin B12 -     Ferritin  History of anemia -     CBC with Differential/Platelet -     TSH -     Basic metabolic panel -     Iron and TIBC -     Folate -     Vitamin B12 -     Ferritin  Other orders -     pantoprazole (PROTONIX) 40 MG tablet; Take 1 tablet (40 mg total) by mouth  daily. -     fluticasone (FLONASE) 50 MCG/ACT nasal spray; Place 1 spray into both nostrils daily.

## 2017-02-16 LAB — CBC WITH DIFFERENTIAL/PLATELET
Basophils Absolute: 60 cells/uL (ref 0–200)
Basophils Relative: 0.9 %
Eosinophils Absolute: 40 cells/uL (ref 15–500)
Eosinophils Relative: 0.6 %
HCT: 38.5 % (ref 35.0–45.0)
Hemoglobin: 12.5 g/dL (ref 11.7–15.5)
Lymphs Abs: 2419 cells/uL (ref 850–3900)
MCH: 25.9 pg — ABNORMAL LOW (ref 27.0–33.0)
MCHC: 32.5 g/dL (ref 32.0–36.0)
MCV: 79.7 fL — ABNORMAL LOW (ref 80.0–100.0)
MPV: 9.9 fL (ref 7.5–12.5)
Monocytes Relative: 4.6 %
Neutro Abs: 3873 cells/uL (ref 1500–7800)
Neutrophils Relative %: 57.8 %
Platelets: 277 10*3/uL (ref 140–400)
RBC: 4.83 10*6/uL (ref 3.80–5.10)
RDW: 12.1 % (ref 11.0–15.0)
Total Lymphocyte: 36.1 %
WBC mixed population: 308 cells/uL (ref 200–950)
WBC: 6.7 10*3/uL (ref 3.8–10.8)

## 2017-02-16 LAB — TSH: TSH: 1.78 mIU/L

## 2017-02-16 LAB — BASIC METABOLIC PANEL
BUN: 14 mg/dL (ref 7–25)
CO2: 23 mmol/L (ref 20–32)
Calcium: 9.6 mg/dL (ref 8.6–10.2)
Chloride: 103 mmol/L (ref 98–110)
Creat: 0.84 mg/dL (ref 0.50–1.10)
Glucose, Bld: 83 mg/dL (ref 65–99)
Potassium: 4.3 mmol/L (ref 3.5–5.3)
Sodium: 135 mmol/L (ref 135–146)

## 2017-02-16 LAB — IRON,TIBC AND FERRITIN PANEL
%SAT: 36 % (calc) (ref 11–50)
Ferritin: 12 ng/mL (ref 10–154)
Iron: 153 ug/dL (ref 40–190)
TIBC: 427 mcg/dL (calc) (ref 250–450)

## 2017-02-16 LAB — FOLATE: Folate: 15.1 ng/mL

## 2017-02-16 LAB — VITAMIN B12: Vitamin B-12: 726 pg/mL (ref 200–1100)

## 2017-02-18 ENCOUNTER — Telehealth: Payer: Self-pay | Admitting: Medical

## 2017-02-18 NOTE — Telephone Encounter (Signed)
Pt called back for her labs results and asked that specifically Vincenza Hews call her back regarding this

## 2017-02-19 ENCOUNTER — Telehealth: Payer: Self-pay | Admitting: Certified Nurse Midwife

## 2017-02-19 NOTE — Telephone Encounter (Signed)
Spoke with patient in regards to benefits for recommended ultrasound. Patient advises "what had been going on did not happen this month."  Patient states she would like to "hold off on scheduling" until next year and states she will check back in with [place] if she has any problems.   Routing to [employer], CNM

## 2017-02-19 NOTE — Telephone Encounter (Signed)
Previous note closed in error. Routing to provider for review.  Spoke with patient in regards to benefits for recommended ultrasound. Patient advises "what had been going on did not happen this month."  Patient states she would like to "hold off on scheduling" until next year and states she will check back in with [place] if she has any problems.   Routing to [employer], CNM

## 2017-03-01 MED FILL — PANTOPRAZOLE SOD DR 40 MG T: 40 | 30 days supply | Qty: 30 | Fill #1

## 2017-03-11 NOTE — Telephone Encounter (Signed)
Left message to call back  

## 2017-03-11 NOTE — Telephone Encounter (Signed)
Please get me on the phone if she calls back

## 2017-03-29 ENCOUNTER — Other Ambulatory Visit: Payer: Self-pay | Admitting: Medical

## 2017-03-29 MED FILL — PANTOPRAZOLE SOD DR 40 MG T: 40 | 90 days supply | Qty: 90 | Fill #0

## 2017-07-05 MED FILL — PANTOPRAZOLE SOD DR 40 MG T: 40 | 90 days supply | Qty: 90 | Fill #1

## 2017-09-02 ENCOUNTER — Other Ambulatory Visit: Payer: Self-pay | Admitting: Medical

## 2017-09-02 ENCOUNTER — Telehealth: Payer: Self-pay | Admitting: Medical

## 2017-09-02 MED ORDER — MONTELUKAST SODIUM 10 MG PO TABS: 10.0000 mg | ORAL_TABLET | Freq: Every day | ORAL | 0 refills | Status: DC

## 2017-09-02 MED FILL — MONTELUKAST SOD 10 MG TAB: 10 | 90 days supply | Qty: 90 | Fill #0

## 2017-09-02 NOTE — Telephone Encounter (Signed)
Pt called and left voicemail and is requesting that she is wanting to restart the RX Singulair, pt uses  [facility] [place] [address] Pt can be reached at [phone]

## 2017-09-02 NOTE — Telephone Encounter (Signed)
Med sent.

## 2017-09-02 NOTE — Telephone Encounter (Signed)
Informed pt that rx was sent to the pharmacy °

## 2017-10-02 MED FILL — PANTOPRAZOLE SOD DR 40 MG T: 40 | 90 days supply | Qty: 90 | Fill #2

## 2017-10-20 ENCOUNTER — Telehealth: Payer: 59 | Admitting: Family

## 2017-10-20 DIAGNOSIS — J019 Acute sinusitis, unspecified: Secondary | ICD-10-CM

## 2017-10-20 MED ORDER — DOXYCYCLINE HYCLATE 100 MG PO TABS: 100.0000 mg | ORAL_TABLET | Freq: Two times a day (BID) | ORAL | 0 refills | Status: DC

## 2017-10-20 NOTE — Progress Notes (Signed)

## 2018-01-20 MED FILL — PANTOPRAZOLE SOD DR 40 MG T: 40 | 90 days supply | Qty: 90 | Fill #3

## 2018-01-24 ENCOUNTER — Encounter: Payer: Self-pay | Admitting: Sports Medicine

## 2018-01-24 ENCOUNTER — Ambulatory Visit (INDEPENDENT_AMBULATORY_CARE_PROVIDER_SITE_OTHER): Payer: 59 | Admitting: Sports Medicine

## 2018-01-24 VITALS — BP 110/64 | Ht 67.0 in | Wt 145.0 lb

## 2018-01-24 DIAGNOSIS — M25552 Pain in left hip: Secondary | ICD-10-CM | POA: Insufficient documentation

## 2018-01-24 DIAGNOSIS — M25551 Pain in right hip: Secondary | ICD-10-CM

## 2018-01-24 MED ORDER — MELOXICAM 15 MG PO TABS: ORAL_TABLET | ORAL | 0 refills | Status: DC

## 2018-01-24 MED FILL — MELOXICAM 15 MG TABLET: 15 | 40 days supply | Qty: 40 | Fill #0

## 2018-01-24 NOTE — Assessment & Plan Note (Signed)
  Pain likely from right gluteus medius muscle weakness given positive trendelenburg test on Right side. However cannot exclude L2/L3 nerve root pain given symptoms consistent with dermatomal distrubution. Discussed with patient various treatment options. Decided to refer to PT for glut medius rehab as well as dry needling, per patient preference. Will give supply of 15 mg mobic to take every night with food for 7 days then as needed after that. If no improvement with mobic could consider steroid dose pack. Follow up 6 weeks for recheck. If no improvement would work up lumbar etiology for pain. The patient indicates understanding of these issues and agrees with the plan.

## 2018-01-24 NOTE — Patient Instructions (Signed)
  Good to see you! Please use the mobic once a day for 7 days around dinner time, take with food. Then use as needed after that. If the pain is not improved please let us know if you'd like to try a steroid dose pack.   Please work with PT to strengthen gluteus medius muscle. Let PT know that we're also considering L2/L3 nerve root pain as well.   Please come back in about 6 weeks for recheck unless things are getting worse instead of better, then we'll see you back sooner.  Dolores PattyAngela Aldean Suddeth, DO PGY-3, [facility] 01/24/2018 11:24 AM

## 2018-01-24 NOTE — Progress Notes (Addendum)
Yehuda SavannahBonnie C Baack - 37 y.o. female MRN [id number]  Date of birth: 01/08/1981  SUBJECTIVE:  Including CC & ROS.   CC: right hip pain  Patient presenting with right hip pain that started 4-5 years ago worse with running. She discontinued running and had initial improvement in the hip but is coming in today because the pain has progressed over the past 1-2 months. She denies any trauma or injury to the area. She does hot yoga for exercise for past 1-2 years without changing her physical activity.  She states the pain is worse at night. She localizes it to her deep buttock region. She states she will rub muscles around there but the pain is too deep to reach. The pain occasionally comes around into the front of the hip and down into her hamstrings. The pain is worse with driving with her right leg extended for more than 30 minutes. She does not have pain sitting normally with legs bent. She walks a lot at work and denies pain with this. She takes 400-600 mg ibuprofen with some relief. This allows her to get some sleep at night otherwise pain will keep her awake. She denies specific low back pain, however she did have a sudden "pop" sensation in her left lower back about 6 months ago when bending over to wash dog. That pain has been unrelated to the pain she feels in her right buttock. She denies any numbness or tingling. No LE muscle weakness that she has noticed.    HISTORY: Past Medical, Surgical, Social, and Family History Reviewed & Updated per EMR.   Pertinent Historical Findings include: PMH- low back pain 6 months ago after bending over, unrelated. GERD. Allergies. Meds- flonase, singulair, ibuprofen as needed   DATA REVIEWED: none  PHYSICAL EXAM:  VS: BP:110/64  HR: bpm  TEMP: ( )  RESP:   HT:5\' 7"  (170.2 cm)   WT:145 lb (65.8 kg)  BMI:22.71 PHYSICAL EXAM:  General: Well appearing, NAD Head: [place]/AT Eyes: Conjunctiva clear Mouth: MMM Cardiac: R radial pulse 2/4 Lungs: Breathing  unlabored Skin: no rashes or skin lesions Neuro: nonfocal Back: No acute abnormalities, no focal tenderness to palpation Gait: normal MSK: no obvious deformities seen. Tender to palpation of right gluts and piriformis. Positive trendelenburg on R, normal on L. Hip abductor strength 5/5 bilaterally. Negative FABR. Hamstring strength 5/5 bilaterally. L4/L2 reflexes symmetrical bilaterally. Neurovascularly in tact distally.    ASSESSMENT & PLAN: See problem based charting & AVS for pt instructions.  Right hip pain  Pain likely from right gluteus medius muscle weakness given positive trendelenburg test on Right side. However cannot exclude L2/L3 nerve root pain given symptoms consistent with dermatomal distrubution. Discussed with patient various treatment options. Decided to refer to PT for glut medius rehab as well as dry needling, per patient preference. Will give supply of 15 mg mobic to take every night with food for 7 days then as needed after that. If no improvement with mobic could consider steroid dose pack. Follow up 6 weeks for recheck. If no improvement would work up lumbar etiology for pain. The patient indicates understanding of these issues and agrees with the plan.     Dolores PattyAngela Lamiah Marmol, DO PGY-3, [facility] 01/24/2018 11:36 AM  Patient seen and evaluated with the resident. I agree with the above plan of care. Patient will start formal physical therapy and follow-up in 6 weeks. If symptoms worsen, consider a 6 day Sterapred Dosepak. We may also need to  consider ruling out lumbar spine pathology as her symptoms are somewhat suggestive of possible L2-L3 nerve root involvement. Patient is encouraged to call me with questions or concerns prior to her follow-up visit.

## 2018-01-31 ENCOUNTER — Telehealth: Payer: Self-pay

## 2018-01-31 ENCOUNTER — Other Ambulatory Visit: Payer: Self-pay

## 2018-01-31 MED ORDER — PREDNISONE 10 MG PO TABS: ORAL_TABLET | ORAL | 0 refills | Status: DC

## 2018-01-31 NOTE — Telephone Encounter (Signed)
6 day prednisone dose pack sent to pharmacy. Pt will stop by Monday to pick up the instructions prior to picking up her prescription.

## 2018-02-04 ENCOUNTER — Telehealth: Payer: Self-pay | Admitting: Certified Nurse Midwife

## 2018-02-04 DIAGNOSIS — N938 Other specified abnormal uterine and vaginal bleeding: Secondary | ICD-10-CM

## 2018-02-04 NOTE — Telephone Encounter (Signed)
Patient is having irregular periods and would like to discuss coming in for an ultrasound.

## 2018-02-04 NOTE — Telephone Encounter (Signed)
Spoke with patient, advised as seen below per Leota Sauers, CNM.   PUS scheduled for 9/12 at 10:30am, consult to follow at 11am with Dr. Edward Jolly.   PUS order placed for precert.   Routing to provider for final review. Patient is agreeable to disposition. Will close encounter.  Cc: Dr. Edward Jolly, [place] [place], [address]

## 2018-02-04 NOTE — Telephone Encounter (Signed)
Yes OK to schedule PUS for evaluation due to DUB continuation

## 2018-02-04 NOTE — Telephone Encounter (Signed)
Spoke with patient. Patient states PUS was recommended at last AEX 02/14/17 for DUB with IUD. Patient declined, wanted to continue to monitor. Patient states bleeding has become more frequent, is spotting or in a full cycle q1-2 wks. Currently spotting. Changes menstrual cup q 3 hrs when on menses.   Reports "menses like" cramping with bleeding. Denies SOB, weakness, fatigue, lightheadedness.   Next AEX scheduled for 02/19/18.   Advised I will review with Leota Sauers, CNM and return call.   Leota Sauers, CNM -please review, ok to proceed with scheduling PUS with MD?

## 2018-02-05 ENCOUNTER — Ambulatory Visit: Payer: 59 | Attending: Sports Medicine | Admitting: Physical Therapy

## 2018-02-05 ENCOUNTER — Ambulatory Visit (INDEPENDENT_AMBULATORY_CARE_PROVIDER_SITE_OTHER): Payer: 59 | Admitting: Family Medicine

## 2018-02-05 ENCOUNTER — Telehealth: Payer: Self-pay | Admitting: Obstetrics and Gynecology

## 2018-02-05 ENCOUNTER — Encounter: Payer: Self-pay | Admitting: Family Medicine

## 2018-02-05 ENCOUNTER — Encounter: Payer: Self-pay | Admitting: Physical Therapy

## 2018-02-05 VITALS — BP 110/80 | HR 56 | Temp 98.3°F | Resp 16 | Wt 153.4 lb

## 2018-02-05 DIAGNOSIS — Q383 Other congenital malformations of tongue: Secondary | ICD-10-CM | POA: Diagnosis not present

## 2018-02-05 DIAGNOSIS — Z862 Personal history of diseases of the blood and blood-forming organs and certain disorders involving the immune mechanism: Secondary | ICD-10-CM | POA: Diagnosis not present

## 2018-02-05 DIAGNOSIS — R200 Anesthesia of skin: Secondary | ICD-10-CM | POA: Diagnosis not present

## 2018-02-05 DIAGNOSIS — N921 Excessive and frequent menstruation with irregular cycle: Secondary | ICD-10-CM

## 2018-02-05 DIAGNOSIS — M25551 Pain in right hip: Secondary | ICD-10-CM | POA: Diagnosis not present

## 2018-02-05 DIAGNOSIS — R202 Paresthesia of skin: Secondary | ICD-10-CM

## 2018-02-05 NOTE — Therapy (Signed)
[facility] [address] [place], [place], [address] Phone: [phone]   Fax:  [phone]  Physical Therapy Evaluation  Patient Details  Name: Lisa Ayers MRN: [id number] Date of Birth: August 28, 1980 Referring Provider: Judi Saa, MD   Encounter Date: 02/05/2018  PT End of Session - 02/05/18 1637    Visit Number  1    Number of Visits  8    Date for PT Re-Evaluation  03/05/18    PT Start Time  1415    PT Stop Time  1503    PT Time Calculation (min)  48 min    Activity Tolerance  Patient tolerated treatment well    Behavior During Therapy  [facility] for tasks assessed/performed       Past Medical History:  Diagnosis Date  . Allergy   . Bilateral bunions   . Dysplasia of cervix, low grade (CIN 1)    03/2016  . Eczema   . GERD (gastroesophageal reflux disease)   . History of abnormal cervical Pap smear 2009, 2010, 2013  . Somatic dysfunction of spine, thoracic   . Thoracic back pain   . Urinary tract infection    several prior  . Wears contact lenses     Past Surgical History:  Procedure Laterality Date  . COLPOSCOPY    . INTRAUTERINE DEVICE INSERTION  12/20/10   [place]    There were no vitals filed for this visit.   Subjective Assessment - 02/05/18 1544    Subjective  Pt relays Rt hip and glute pain that sarted 4-5 years ago during running. This pain has gotten worse over the last month. She now has stopped regular running but does still play tennis and do hot yoga.    Limitations  Lifting;Standing;Walking    How long can you sit comfortably?  less than one hour    How long can you stand comfortably?  depends    How long can you walk comfortably?  depends    Diagnostic tests  not for lumbar or hip    Patient Stated Goals  pain free    Currently in Pain?  Yes    Pain Score  4     Pain Location  Hip    Pain Orientation  Right    Pain Descriptors / Indicators  Burning;Tightness    Pain Type  Chronic pain     Pain Radiating Towards  Rt knee    Pain Onset  More than a month ago    Pain Frequency  Intermittent    Aggravating Factors   stairs, driving more than 20 min, laying down on Rt side    Pain Relieving Factors  ice, rest         [facility] PT Assessment - 02/05/18 0001      Assessment   Medical Diagnosis  Rt hip and glute pain    Referring Provider  Judi Saa, MD    Next MD Visit  --   4 weeks   Prior Therapy  none      Precautions   Precautions  None      Restrictions   Weight Bearing Restrictions  No      Balance Screen   Has the patient fallen in the past 6 months  No      Prior Function   Level of Independence  Independent    Vocation  Full time employment    Mining engineer therapist at [employer]  Cognition   Overall Cognitive Status  Within Functional Limits for tasks assessed      Observation/Other Assessments   Focus on Therapeutic Outcomes (FOTO)   31% limited      Functional Tests   Functional tests  Single Leg Squat;Hopping      Single Leg Squat   Comments  no trendelburg noted, can hold 15 sec      Hopping   Comments  SL X 5 reps, mild pain after 5      Posture/Postural Control   Posture Comments  WNL      ROM / Strength   AROM / PROM / Strength  AROM;Strength      AROM   AROM Assessment Site  Hip    Right/Left Hip  Right    Right Hip Extension  --   WNL   Right Hip Flexion  --   WNL   Right Hip External Rotation   40    Right Hip Internal Rotation   20    Right/Left Knee  Right      Strength   Overall Strength Comments  normal lumbar ROM    Strength Assessment Site  Hip;Knee    Right/Left Hip  Right    Right Hip Flexion  4/5    Right Hip Extension  4+/5    Right Hip External Rotation   5/5    Right Hip Internal Rotation  5/5    Right Hip ABduction  4+/5    Right/Left Knee  Right    Right Knee Flexion  5/5    Right Knee Extension  5/5      Flexibility   Soft Tissue Assessment /Muscle Length  --   tightness in  glutes,TFL,IT band     Palpation   Palpation comment  TTP with T.P in glutes and priformis       Special Tests   Other special tests  +FABERS, neg SLR      Ambulation/Gait   Gait Comments  WFL                Objective measurements completed on examination: See above findings.      [facility] Adult PT Treatment/Exercise - 02/05/18 0001      Exercises   Exercises  Knee/Hip      Knee/Hip Exercises: Stretches   ITB Stretch  3 reps;30 seconds;Right    Piriformis Stretch  Right;3 reps;30 seconds      Modalities   Modalities  Cryotherapy      Cryotherapy   Number Minutes Cryotherapy  10 Minutes   with HEP and POC edu   Cryotherapy Location  Knee    Type of Cryotherapy  Ice pack      Manual Therapy   Manual therapy comments  T.P release to glutes/piriformis, hip distraction mobs             PT Education - 02/05/18 1636    Education Details  HEP, POC, ice    Person(s) Educated  Patient    Methods  Explanation;Demonstration;Verbal cues;Handout    Comprehension  Verbalized understanding;Returned demonstration;Need further instruction          PT Long Term Goals - 02/05/18 1642      PT LONG TERM GOAL #1   Title  Pt will be I and compliant with HEP. 4 weeks 03/05/18    Status  New      PT LONG TERM GOAL #2   Title  Pt will improve FOTO to less than  24% limited. 4 weeks 03/05/18      PT LONG TERM GOAL #3   Title  Pt will improve Rt hip strength to 5/5 MMT. 4 weeks 03/05/18      PT LONG TERM GOAL #4   Title  Pt will improve Rt hip IR by 10 deg. 4 weeks 03/05/18      PT LONG TERM GOAL #5   Title  Pt will report less than 1-2/10 overall pain with usual activity and work and stairs. 4 weeks 03/05/18             Plan - 02/05/18 1638    Clinical Impression Statement  Pt presents with Rt hip and glute pain likely glute tendonopathy/piriformis syndrome or hip bursitis. Lumbar was cleared as she had neg SLR and no reproduction with lumbar mobs or lumbar  ROM. She does have trigger points and tightness in glutes, piriformis, and IT band, decreased hip rotation ROM, decreased hip strength, and increased pain limiting her full function. She will benefit from skilled PT to address her deficits.     Clinical Presentation  Stable    Clinical Decision Making  Low    Rehab Potential  Good    PT Frequency  2x / week    PT Duration  4 weeks    PT Treatment/Interventions  Cryotherapy;Electrical Stimulation;Iontophoresis 4mg /ml Dexamethasone;Moist Heat;Ultrasound;Therapeutic exercise;Neuromuscular re-education;Passive range of motion;Dry needling;Taping;Joint Manipulations    PT Next Visit Plan  review HEP, pt and MD requesting DN     PT Home Exercise Plan  hip stretching and strengthening    Consulted and Agree with Plan of Care  Patient       Patient will benefit from skilled therapeutic intervention in order to improve the following deficits and impairments:  Decreased activity tolerance, Decreased range of motion, Decreased strength, Increased fascial restricitons, Impaired flexibility, Pain  Visit Diagnosis: Pain in right hip     Problem List Patient Active Problem List   Diagnosis Date Noted  . Right hip pain 01/24/2018  . Vision disturbance 02/15/2017  . History of anemia 02/15/2017  . Annual physical exam 01/19/2015  . Gastroesophageal reflux disease without esophagitis 01/19/2015  . Rhinitis, allergic 01/19/2015  . Other back pain 01/19/2015  . Eczema 04/25/2011    April Manson, PT, DPT 02/05/2018, 4:50 PM  [facility] [address] [place], [place], [address] Phone: [phone]   Fax:  [phone]  Name: Lisa Ayers MRN: [id number] Date of Birth: 09/26/1980

## 2018-02-05 NOTE — Telephone Encounter (Signed)
Patient returned call. Spoke with patoent regarding benefit for scheduled ultrasound. Patient understood and agreeable. Patient scheduled 02/06/18 with Dr Edward Jolly. Patient aware of appointment date, arrival time and cancellation policy.  No further questions. Ok to close

## 2018-02-05 NOTE — Telephone Encounter (Signed)
Call placed to patient to review benefits for scheduled ultrasound. Left voicemail message requesting a return call °

## 2018-02-05 NOTE — Progress Notes (Signed)
Subjective:    Patient ID: Lisa Ayers, female    DOB: 08-25-1980, 37 y.o.   MRN: [id number]  HPI Chief Complaint  Patient presents with  . tingling    tingling in left 2 middle fingers for 2 weeks and then yesterday tingling in mouth,tongue and toes-  lasted a couple hours   She is here with complaints of a 2 week history of constant tingling in distal portions of the 3rd 4th digits on her left hand. This has been waxing and waning in intensity. No injury.  States the left side of her tongue also tingles intermittently and she is experiencing this in the office today. She also reports the toes on her left foot has been tingling but this has resolved.  Denies fever, chills, dizziness, fatigue, weight changes, temperature changes, vision changes, chest pain, palpitations, shortness of breath, cough, abdominal pain, N/V/D, urinary symptoms, LE edema. No weakness or trouble with gait.  She recently started on meloxicam for hip pain.  No other medication changes. 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   Reports a history of headaches with aura and "floaters" in the past. States she thinks these are complex migraines. Has never seen a neurologist or had a brain scan.   Periods have been heavy and she has a history of IDA. She is being evaluated by OB/GYN for this.   Reviewed allergies, medications, past medical, surgical, family, and social history.  Review of Systems Pertinent positives and negatives in the history of present illness.     Objective:   Physical Exam  Constitutional: She is oriented to person, place, and time. She appears well-developed and well-nourished. No distress.  HENT:  Nose: Nose normal.  Mouth/Throat: Oropharynx is clear and moist.  Eyes: Pupils are equal, round, and reactive to light. Conjunctivae and EOM are normal. No scleral icterus.  Neck: Normal range of motion. Neck supple. No thyromegaly present.  Cardiovascular: Normal rate,  regular rhythm, normal heart sounds and intact distal pulses.  Pulmonary/Chest: Effort normal and breath sounds normal.  Musculoskeletal: Normal range of motion.  Lymphadenopathy:    She has no cervical adenopathy.  Neurological: She is alert and oriented to person, place, and time. No cranial nerve deficit or sensory deficit. She exhibits normal muscle tone. She displays a negative Romberg sign. Coordination and gait normal.  Reflex Scores:      Patellar reflexes are 1+ on the right side and 1+ on the left side. No facial asymmetry or pronator drift. Normal finger to nose.   Skin: Skin is warm and dry. Capillary refill takes less than 2 seconds. No rash noted. No pallor.  Psychiatric: She has a normal mood and affect. Her speech is normal and behavior is normal. Judgment and thought content normal. Cognition and memory are normal.   BP 110/80   Pulse (!) 56   Temp 98.3 F (36.8 C) (Oral)   Resp 16   Wt 153 lb 6.4 oz (69.6 kg)   SpO2 99%   BMI 24.03 kg/m       Assessment & Plan:  Numbness and tingling in left hand - Plan: CBC with Differential/Platelet, Comprehensive metabolic panel, TSH, T4, free, T3, Vitamin B12, Iron, TIBC and Ferritin Panel  Tongue abnormality  Menorrhagia with irregular cycle - Plan: Iron, TIBC and Ferritin Panel  History of anemia - Plan: CBC with Differential/Platelet, Iron, TIBC and Ferritin Panel  Neurovascular exam is unremarkable. No sign of systemic illness. Will check labs  and follow up. Most likely she will need a referral to neuro. Strict precaution to report back or seek medical attention for any new symptoms particularly stroke like symptoms.

## 2018-02-06 ENCOUNTER — Encounter: Payer: Self-pay | Admitting: Obstetrics and Gynecology

## 2018-02-06 ENCOUNTER — Ambulatory Visit (INDEPENDENT_AMBULATORY_CARE_PROVIDER_SITE_OTHER): Payer: 59 | Admitting: Obstetrics and Gynecology

## 2018-02-06 ENCOUNTER — Ambulatory Visit (INDEPENDENT_AMBULATORY_CARE_PROVIDER_SITE_OTHER): Payer: 59

## 2018-02-06 ENCOUNTER — Encounter: Payer: Self-pay | Admitting: Family Medicine

## 2018-02-06 VITALS — BP 102/58 | HR 72 | Resp 14 | Ht 66.25 in | Wt 151.8 lb

## 2018-02-06 DIAGNOSIS — N938 Other specified abnormal uterine and vaginal bleeding: Secondary | ICD-10-CM

## 2018-02-06 DIAGNOSIS — N83201 Unspecified ovarian cyst, right side: Secondary | ICD-10-CM

## 2018-02-06 LAB — T4, FREE: Free T4: 1.15 ng/dL (ref 0.82–1.77)

## 2018-02-06 LAB — COMPREHENSIVE METABOLIC PANEL
ALT: 16 IU/L (ref 0–32)
AST: 25 IU/L (ref 0–40)
Albumin/Globulin Ratio: 2.1 (ref 1.2–2.2)
Albumin: 4.7 g/dL (ref 3.5–5.5)
Alkaline Phosphatase: 181 IU/L — ABNORMAL HIGH (ref 39–117)
BUN/Creatinine Ratio: 13 (ref 9–23)
BUN: 10 mg/dL (ref 6–20)
Bilirubin Total: 0.3 mg/dL (ref 0.0–1.2)
CO2: 24 mmol/L (ref 20–29)
Calcium: 9.5 mg/dL (ref 8.7–10.2)
Chloride: 105 mmol/L (ref 96–106)
Creatinine, Ser: 0.79 mg/dL (ref 0.57–1.00)
GFR calc Af Amer: 110 mL/min/{1.73_m2} (ref >59)
GFR calc non Af Amer: 95 mL/min/{1.73_m2} (ref >59)
Globulin, Total: 2.2 g/dL (ref 1.5–4.5)
Glucose: 77 mg/dL (ref 65–99)
Potassium: 4.8 mmol/L (ref 3.5–5.2)
Sodium: 141 mmol/L (ref 134–144)
Total Protein: 6.9 g/dL (ref 6.0–8.5)

## 2018-02-06 LAB — CBC WITH DIFFERENTIAL/PLATELET
Basophils Absolute: 0.1 10*3/uL (ref 0.0–0.2)
Basos: 1 %
EOS (ABSOLUTE): 0.1 10*3/uL (ref 0.0–0.4)
Eos: 1 %
Hematocrit: 36.7 % (ref 34.0–46.6)
Hemoglobin: 11.9 g/dL (ref 11.1–15.9)
Immature Grans (Abs): 0 10*3/uL (ref 0.0–0.1)
Immature Granulocytes: 0 %
Lymphocytes Absolute: 2 10*3/uL (ref 0.7–3.1)
Lymphs: 33 %
MCH: 26.7 pg (ref 26.6–33.0)
MCHC: 32.4 g/dL (ref 31.5–35.7)
MCV: 83 fL (ref 79–97)
Monocytes Absolute: 0.4 10*3/uL (ref 0.1–0.9)
Monocytes: 6 %
Neutrophils Absolute: 3.6 10*3/uL (ref 1.4–7.0)
Neutrophils: 59 %
Platelets: 268 10*3/uL (ref 150–450)
RBC: 4.45 x10E6/uL (ref 3.77–5.28)
RDW: 12.3 % (ref 12.3–15.4)
WBC: 6.2 10*3/uL (ref 3.4–10.8)

## 2018-02-06 LAB — IRON,TIBC AND FERRITIN PANEL
Ferritin: 21 ng/mL (ref 15–150)
Iron Saturation: 19 % (ref 15–55)
Iron: 65 ug/dL (ref 27–159)
Total Iron Binding Capacity: 336 ug/dL (ref 250–450)
UIBC: 271 ug/dL (ref 131–425)

## 2018-02-06 LAB — VITAMIN B12: Vitamin B-12: 948 pg/mL (ref 232–1245)

## 2018-02-06 LAB — T3: T3, Total: 91 ng/dL (ref 71–180)

## 2018-02-06 LAB — POCT URINE PREGNANCY: Preg Test, Ur: NEGATIVE

## 2018-02-06 LAB — TSH: TSH: 2.14 u[IU]/mL (ref 0.450–4.500)

## 2018-02-06 MED ORDER — MEDROXYPROGESTERONE ACETATE 10 MG PO TABS: 10.0000 mg | ORAL_TABLET | Freq: Every day | ORAL | 0 refills | Status: DC

## 2018-02-06 MED FILL — MEDROXYPROGESTERONE 10 MG T: 10 | 10 days supply | Qty: 10 | Fill #0

## 2018-02-06 NOTE — Progress Notes (Signed)
GYNECOLOGY  VISIT   HPI: 37 y.o.   Divorced  Caucasian  female   G1P1001 with Patient's last menstrual period was 02/03/2018.   here for ultrasound    Patient of Sara ChuDebbie Leonard.  Has ParaGard IUD placed 7 years ago.   Is spotting or having more menstrual type flow every 1 - 2 weeks.  For the past year has her period and midcycle spotting.  For the past couple of months, has her period and a lot of spotting.  Bled from August 14 - 19, and then restarted again August 25 - 30.  Bled September 9 - 12.  Changes Diva cup every 3 hours on cycle.   One episode of post coital bleeding.   Lost 10 pounds intentionally.  Exercise pattern is the same.   Started Mobic for hip pain and doing PT.  No change in sexual partners.   Has annual on 02/19/18.  Due for a pap this year.  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    UPT - negative.   GYNECOLOGIC HISTORY: Patient's last menstrual period was 02/03/2018. Contraception:  Paragard Menopausal hormone therapy:  n/a Last mammogram:  n/a Last pap smear:   02/14/17 Neg:Neg HR HPV        OB History    Gravida  1   Para  1   Term  1   Preterm  0   AB  0   Living  1     SAB  0   TAB  0   Ectopic  0   Multiple  0   Live Births  1              Patient Active Problem List   Diagnosis Date Noted  . Right hip pain 01/24/2018  . Vision disturbance 02/15/2017  . History of anemia 02/15/2017  . Annual physical exam 01/19/2015  . Gastroesophageal reflux disease without esophagitis 01/19/2015  . Rhinitis, allergic 01/19/2015  . Other back pain 01/19/2015  . Eczema 04/25/2011    Past Medical History:  Diagnosis Date  . Allergy   . Bilateral bunions   . Carpal tunnel syndrome   . Dysplasia of cervix, low grade (CIN 1)    03/2016  . Eczema   . GERD (gastroesophageal reflux disease)   . History of abnormal cervical Pap smear 2009, 2010, 2013  . Somatic dysfunction of spine,  thoracic   . Thoracic back pain   . Urinary tract infection    several prior  . Wears contact lenses     Past Surgical History:  Procedure Laterality Date  . COLPOSCOPY    . INTRAUTERINE DEVICE INSERTION  12/20/10   Paraguard    Current Outpatient Medications  Medication Sig Dispense Refill  . fluticasone (FLONASE) 50 MCG/ACT nasal spray Place 1 spray into both nostrils daily. 16 g 11  . meloxicam (MOBIC) 15 MG tablet Take 1 tablet daily with food for 7 days. Then take as needed. 40 tablet 0  . montelukast (SINGULAIR) 10 MG tablet Take 1 tablet (10 mg total) by mouth daily. 90 tablet 0  . pantoprazole (PROTONIX) 40 MG tablet Take 1 tablet (40 mg total) by mouth daily. 90 tablet 3  . PARAGARD INTRAUTERINE COPPER IU by Intrauterine route.     No current facility-administered medications for this visit.      ALLERGIES: Augmentin [amoxicillin-pot clavulanate] and Sulfur  Family History  Problem Relation Age of Onset  .  Depression Mother   . Diabetes Mother   . Thyroid disease Mother   . Glaucoma Mother   . Diabetes Father   . Diabetes Maternal Grandmother   . Stroke Maternal Grandmother   . Heart disease Maternal Grandmother        CABG  . Breast cancer Paternal Grandmother 73          . COPD Paternal Grandmother     Social History   Socioeconomic History  . Marital status: Divorced    Spouse name: Not on file  . Number of children: 1  . Years of education: Not on file  . Highest education level: Not on file  Occupational History  . Occupation: Audiological scientist: [employer]  Social Needs  . Financial resource strain: Not on file  . Food insecurity:    Worry: Not on file    Inability: Not on file  . Transportation needs:    Medical: Not on file    Non-medical: Not on file  Tobacco Use  . Smoking status: Never Smoker  . Smokeless tobacco: Never Used  Substance and Sexual Activity  . Alcohol use: Yes    Alcohol/week: 0.0 - 5.0 standard drinks   . Drug use: No  . Sexual activity: Yes    Partners: Male    Birth control/protection: IUD  Lifestyle  . Physical activity:    Days per week: Not on file    Minutes per session: Not on file  . Stress: Not on file  Relationships  . Social connections:    Talks on phone: Not on file    Gets together: Not on file    Attends religious service: Not on file    Active member of club or organization: Not on file    Attends meetings of clubs or organizations: Not on file    Relationship status: Not on file  . Intimate partner violence:    Fear of current or ex partner: Not on file    Emotionally abused: Not on file    Physically abused: Not on file    Forced sexual activity: Not on file  Other Topics Concern  . Not on file  Social History Narrative   Lives with herhusband and her daughter [name], 1 dog.  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  Runs, likes to read.  Agnostic.      Review of Systems  Genitourinary:       Menstrual cycle changes Unscheduled bleeding or spotting  All other systems reviewed and are negative.   PHYSICAL EXAMINATION:    BP (!) 102/58 (BP Location: Right Arm, Patient Position: Sitting, Cuff Size: Normal)   Pulse 72   Resp 14   Ht 5' 6.25" (1.683 m)   Wt 151 lb 12.8 oz (68.9 kg)   LMP 02/03/2018   BMI 24.32 kg/m     General appearance: alert, cooperative and appears stated age    Pelvic: External genitalia:  no lesions              Urethra:  normal appearing urethra with no masses, tenderness or lesions              Bartholins and Skenes: normal                 Vagina: normal appearing vagina with normal color and discharge, no lesions              Cervix: no lesions. IUD strings seen.  Bimanual Exam:  Uterus:  normal size, contour, position, consistency, mobility, non-tender              Adnexa: no mass, fullness, tenderness            Pelvic US IUD in correct position.  No endometrial or myometrial masses.  EMS 4.17. Small  simple right ovarian cyst.  No free fluid.  Normal Chaperone was present for exam.  ASSESSMENT   Dysfunctional uterine bleeding.  ParaGard IUD patient.  Small simple right ovarian cyst.  Hx LGSIL.  PLAN  Discussion of options for care - cycle of Provera, consideration of progesterone IUD.  She will do a course of Provera 10 mg x 10 days.   No [place] follow up needed. EMB if bleeding pattern persists. FU with Sara Chu for annual exam and pap.  She will need to move her appointment date so she is not having bleeding at the time of her visit.   An After Visit Summary was printed and given to the patient.  __15____ minutes face to face time of which over 50% was spent in counseling.

## 2018-02-06 NOTE — Patient Instructions (Signed)
Dysfunctional Uterine Bleeding °Dysfunctional uterine bleeding is abnormal bleeding from the uterus. Dysfunctional uterine bleeding includes: °· A period that comes earlier or later than usual. °· A period that is lighter, heavier, or has blood clots. °· Bleeding between periods. °· Skipping one or more periods. °· Bleeding after sexual intercourse. °· Bleeding after menopause. ° °Follow these instructions at home: °Pay attention to any changes in your symptoms. Follow these instructions to help with your condition: °Eating and drinking °· Eat well-balanced meals. Include foods that are high in iron, such as liver, meat, shellfish, green leafy vegetables, and eggs. °· If you become constipated: °? Drink plenty of water. °? Eat fruits and vegetables that are high in water and fiber, such as spinach, carrots, raspberries, apples, and mango. °Medicines °· Take over-the-counter and prescription medicines only as told by your health care provider. °· Do not change medicines without talking with your health care provider. °· Aspirin or medicines that contain aspirin may make the bleeding worse. Do not take those medicines: °? During the week before your period. °? During your period. °· If you were prescribed iron pills, take them as told by your health care provider. Iron pills help to replace iron that your body loses because of this condition. °Activity °· If you need to change your sanitary pad or tampon more than one time every 2 hours: °? Lie in bed with your feet raised (elevated). °? Place a cold pack on your lower abdomen. °? Rest as much as possible until the bleeding stops or slows down. °· Do not try to lose weight until the bleeding has stopped and your blood iron level is back to normal. °Other Instructions °· For two months, write down: °? When your period starts. °? When your period ends. °? When any abnormal bleeding occurs. °? What problems you notice. °· Keep all follow up visits as told by your health  care provider. This is important. °Contact a health care provider if: °· You get light-headed or weak. °· You have nausea and vomiting. °· You cannot eat or drink without vomiting. °· You feel dizzy or have diarrhea while you are taking medicines. °· You are taking birth control pills or hormones, and you want to change them or stop taking them. °Get help right away if: °· You develop a fever or chills. °· You need to change your sanitary pad or tampon more than one time per hour. °· Your bleeding becomes heavier, or your flow contains clots more often. °· You develop pain in your abdomen. °· You lose consciousness. °· You develop a rash. °This information is not intended to replace advice given to you by your health care provider. Make sure you discuss any questions you have with your health care provider. °Document Released: 05/11/2000 Document Revised: 10/20/2015 Document Reviewed: 08/09/2014 °Elsevier Interactive Patient Education © 2018 Elsevier Inc. ° °

## 2018-02-10 ENCOUNTER — Encounter: Payer: Self-pay | Admitting: Physical Therapy

## 2018-02-10 ENCOUNTER — Ambulatory Visit: Payer: 59 | Admitting: Physical Therapy

## 2018-02-10 DIAGNOSIS — M25551 Pain in right hip: Secondary | ICD-10-CM | POA: Diagnosis not present

## 2018-02-10 NOTE — Therapy (Signed)
[place], [place], [address] Phone: [phone]   Fax:  [phone]  Physical Therapy Treatment  Patient Details  Name: Lisa Ayers MRN: [id number] Date of Birth: 26-Aug-1980 Referring Provider: Renata Caprice, MD   Encounter Date: 02/10/2018  PT End of Session - 02/10/18 0931    Visit Number  2    Number of Visits  8    Date for PT Re-Evaluation  03/05/18    PT Start Time  0931    PT Stop Time  1021    PT Time Calculation (min)  50 min    Activity Tolerance  Patient tolerated treatment well    Behavior During Therapy  [facility] for tasks assessed/performed       Past Medical History:  Diagnosis Date  . Allergy   . Bilateral bunions   . Carpal tunnel syndrome   . Dysplasia of cervix, low grade (CIN 1)    03/2016  . Eczema   . GERD (gastroesophageal reflux disease)   . History of abnormal cervical Pap smear 2009, 2010, 2013  . Somatic dysfunction of spine, thoracic   . Thoracic back pain   . Urinary tract infection    several prior  . Wears contact lenses     Past Surgical History:  Procedure Laterality Date  . COLPOSCOPY    . INTRAUTERINE DEVICE INSERTION  12/20/10   Paraguard    There were no vitals filed for this visit.  Subjective Assessment - 02/10/18 0932    Subjective  Pt reports she is doing her HEP, is ready for DN today.  Feels it a lot with sidelying leg lifts, using mobic. Pain is more managable     Patient Stated Goals  pain free    Currently in Pain?  Yes    Pain Score  2     Pain Location  Buttocks    Pain Orientation  Right    Pain Descriptors / Indicators  Aching   deep   Pain Radiating Towards  into lateral thigh and knee    Pain Onset  More than a month ago    Pain Frequency  Intermittent    Aggravating Factors   driving    Pain Relieving Factors  mobic                       [facility] Adult PT Treatment/Exercise - 02/10/18 0001      Exercises   Exercises  Knee/Hip      Knee/Hip Exercises: Stretches   ITB Stretch  Right;2 reps;30 seconds   cross body stretch with strap   Other Knee/Hip Stretches  rolling on foam roller to get into Rt buttocks, hip rotators  Pigeon pose Rt LE      Knee/Hip Exercises: Aerobic   Nustep  L4x5'      Modalities   Modalities  Electrical Stimulation;Moist Heat      Moist Heat Therapy   Number Minutes Moist Heat  15 Minutes    Moist Heat Location  --   buttocks     Electrical Stimulation   Electrical Stimulation Location  Rt buttocks    Electrical Stimulation Action  IFC    Electrical Stimulation Parameters  to tolerance    Electrical Stimulation Goals  Pain;Tone      Manual Therapy   Manual Therapy  Soft tissue mobilization;Passive ROM    Soft tissue mobilization  STM into Rt gluts, piriformis    Passive  ROM  Rt hip rotating with passive release into the insertion of the rotators       Trigger Point Dry Needling - 02/10/18 1001    Consent Given?  Yes    Education Handout Provided  Yes    Muscles Treated Lower Body  Gluteus maximus;Gluteus minimus;Piriformis   all Rt side   Gluteus Maximus Response  Palpable increased muscle length;Twitch response elicited    Gluteus Minimus Response  Palpable increased muscle length;Twitch response elicited    Piriformis Response  Palpable increased muscle length;Twitch response elicited                PT Long Term Goals - 02/10/18 1008      PT LONG TERM GOAL #1   Title  Pt will be I and compliant with HEP. 4 weeks 03/05/18    Status  On-going      PT LONG TERM GOAL #2   Title  Pt will improve FOTO to less than 24% limited. 4 weeks 03/05/18    Status  On-going      PT LONG TERM GOAL #3   Title  Pt will improve Rt hip strength to 5/5 MMT. 4 weeks 03/05/18    Status  On-going      PT LONG TERM GOAL #4   Title  Pt will improve Rt hip IR by 10 deg. 4 weeks 03/05/18    Status  On-going      PT LONG TERM GOAL #5   Title  Pt will report  less than 1-2/10 overall pain with usual activity and work and stairs. 4 weeks 03/05/18    Status  On-going            Plan - 02/10/18 1009    Clinical Impression Statement  This is Lisa Ayers's second visit, did well with manual work,reporting increased feelings of mobility.  No goals met, she does have alot of palpable tightness in her Rt hip rotators.  Once these muscles release she would benefit from high level SLS strengthening exercise.     Rehab Potential  Good    PT Frequency  2x / week    PT Duration  4 weeks    PT Treatment/Interventions  Cryotherapy;Electrical Stimulation;Iontophoresis 23m/ml Dexamethasone;Moist Heat;Ultrasound;Therapeutic exercise;Neuromuscular re-education;Passive range of motion;Dry needling;Taping;Joint Manipulations    PT Next Visit Plan  assess response to DN/manual work, she may need more into the Rt hip rotators.  Progress HEP    Consulted and Agree with Plan of Care  Patient       Patient will benefit from skilled therapeutic intervention in order to improve the following deficits and impairments:  Decreased activity tolerance, Decreased range of motion, Decreased strength, Increased fascial restricitons, Impaired flexibility, Pain  Visit Diagnosis: Pain in right hip     Problem List Patient Active Problem List   Diagnosis Date Noted  . Right hip pain 01/24/2018  . Vision disturbance 02/15/2017  . History of anemia 02/15/2017  . Annual physical exam 01/19/2015  . Gastroesophageal reflux disease without esophagitis 01/19/2015  . Rhinitis, allergic 01/19/2015  . Other back pain 01/19/2015  . Eczema 04/25/2011    SJeral PinchPT 02/10/2018, 10:11 AM  [facility] [address]Phone: [phone]  Fax:  [phone] Name: Lisa RUANMRN: [id number] of Birth: 509-22-1982

## 2018-02-13 ENCOUNTER — Ambulatory Visit: Payer: 59 | Admitting: Physical Therapy

## 2018-02-13 ENCOUNTER — Encounter: Payer: Self-pay | Admitting: Physical Therapy

## 2018-02-13 ENCOUNTER — Telehealth: Payer: Self-pay | Admitting: Physical Therapy

## 2018-02-13 DIAGNOSIS — M25551 Pain in right hip: Secondary | ICD-10-CM | POA: Diagnosis not present

## 2018-02-13 NOTE — Therapy (Signed)
[facility] [address] [place], [place], [address] Phone: [phone]   Fax:  [phone]  Physical Therapy Treatment  Patient Details  Name: Lisa Ayers MRN: [id number] Date of Birth: 05/01/1981 Referring Provider: Judi Saa. Draper, MD   Encounter Date: 02/13/2018  PT End of Session - 02/13/18 1356    Visit Number  3    Number of Visits  8    Date for PT Re-Evaluation  03/05/18    PT Start Time  0930    PT Stop Time  1014    PT Time Calculation (min)  44 min    Activity Tolerance  Patient tolerated treatment well    Behavior During Therapy  [facility] for tasks assessed/performed       Past Medical History:  Diagnosis Date  . Allergy   . Bilateral bunions   . Carpal tunnel syndrome   . Dysplasia of cervix, low grade (CIN 1)    03/2016  . Eczema   . GERD (gastroesophageal reflux disease)   . History of abnormal cervical Pap smear 2009, 2010, 2013  . Somatic dysfunction of spine, thoracic   . Thoracic back pain   . Urinary tract infection    several prior  . Wears contact lenses     Past Surgical History:  Procedure Laterality Date  . COLPOSCOPY    . INTRAUTERINE DEVICE INSERTION  12/20/10   Paraguard    There were no vitals filed for this visit.  Subjective Assessment - 02/13/18 1352    Subjective  Patient reports the dry needling was sore for a day or two but it helped. After the needling she reported some anterior pain and slight pain into her groing. She was able to stretch out her groun pain.     Limitations  Lifting;Standing;Walking    How long can you sit comfortably?  less than one hour    How long can you stand comfortably?  depends    How long can you walk comfortably?  depends    Diagnostic tests  not for lumbar or hip    Patient Stated Goals  pain free    Currently in Pain?  No/denies   only tednerness to palaption                      [facility] - 02/13/18 0001       Manual Therapy   Manual Therapy  Manual Traction    Manual therapy comments  skilled palaption of trigger points     Soft tissue mobilization  STM into Rt gluts, piriformis, IT band using rollwe     Passive ROM  Rt hip rotating with passive release     Manual Traction  inferior glides ogf the hip to improve flexion        Trigger Point Dry Needling - 02/13/18 1351    Consent Given?  Yes    Muscles Treated Lower Body  Tensor fascia lata    Gluteus Maximus Response  Twitch response elicited    Gluteus Minimus Response  Twitch response elicited    Piriformis Response  Twitch response elicited    Tensor Fascia Lata Response  Twitch response elicited           PT Education - 02/13/18 1355    Education Details  updated HEP, benefits and risks of TPDN     Person(s) Educated  Patient    Methods  Explanation;Demonstration;Tactile cues;Verbal cues  Comprehension  Verbalized understanding;Returned demonstration;Verbal cues required;Tactile cues required          PT Long Term Goals - 02/10/18 1008      PT LONG TERM GOAL #1   Title  Pt will be I and compliant with HEP. 4 weeks 03/05/18    Status  On-going      PT LONG TERM GOAL #2   Title  Pt will improve FOTO to less than 24% limited. 4 weeks 03/05/18    Status  On-going      PT LONG TERM GOAL #3   Title  Pt will improve Rt hip strength to 5/5 MMT. 4 weeks 03/05/18    Status  On-going      PT LONG TERM GOAL #4   Title  Pt will improve Rt hip IR by 10 deg. 4 weeks 03/05/18    Status  On-going      PT LONG TERM GOAL #5   Title  Pt will report less than 1-2/10 overall pain with usual activity and work and stairs. 4 weeks 03/05/18    Status  On-going            Plan - 02/13/18 1415    Clinical Impression Statement  Patient tolerated needling well. The needling of her TFL reporduced the pain running down the front. Therapy added glut strengthening exercises for pre-running and to help reduce post needle sorenes.s She  still apears to be lacking some hip flexion compared to the other side. Therapy pefromed inferior hip glides to improve flexion.     Clinical Presentation  Stable    Clinical Decision Making  Low    Rehab Potential  Good    PT Frequency  2x / week    PT Duration  8 weeks    PT Treatment/Interventions  Cryotherapy;Electrical Stimulation;Iontophoresis 4mg /ml Dexamethasone;Moist Heat;Ultrasound;Therapeutic exercise;Neuromuscular re-education;Passive range of motion;Dry needling;Taping;Joint Manipulations    PT Next Visit Plan  assess response to DN/manual work, she may need more into the Rt hip rotators.  Progress HEP; review respose to TPDN adance into single leg activity when able.     PT Home Exercise Plan  hip stretching and strengthening    Consulted and Agree with Plan of Care  Patient       Patient will benefit from skilled therapeutic intervention in order to improve the following deficits and impairments:  Decreased activity tolerance, Decreased range of motion, Decreased strength, Increased fascial restricitons, Impaired flexibility, Pain  Visit Diagnosis: Pain in right hip     Problem List Patient Active Problem List   Diagnosis Date Noted  . Right hip pain 01/24/2018  . Vision disturbance 02/15/2017  . History of anemia 02/15/2017  . Annual physical exam 01/19/2015  . Gastroesophageal reflux disease without esophagitis 01/19/2015  . Rhinitis, allergic 01/19/2015  . Other back pain 01/19/2015  . Eczema 04/25/2011    Dessie Coma PT DPT  02/13/2018, 4:50 PM  [facility] [address] [place], [place], [address] Phone: [phone]   Fax:  [phone]  Name: Lisa Ayers MRN: [id number] Date of Birth: 01-09-1981

## 2018-02-13 NOTE — Telephone Encounter (Signed)
Left message regarding pt missing her appointment today

## 2018-02-17 ENCOUNTER — Encounter: Payer: Self-pay | Admitting: Physical Therapy

## 2018-02-17 ENCOUNTER — Ambulatory Visit: Payer: 59 | Admitting: Physical Therapy

## 2018-02-17 DIAGNOSIS — M25551 Pain in right hip: Secondary | ICD-10-CM | POA: Diagnosis not present

## 2018-02-18 ENCOUNTER — Encounter: Payer: Self-pay | Admitting: Physical Therapy

## 2018-02-18 NOTE — Therapy (Signed)
[facility] [address] [place], [place], [address] Phone: [phone]   Fax:  [phone]  Physical Therapy Treatment  Patient Details  Name: Lisa Ayers MRN: [id number] Date of Birth: 11/17/1980 Referring Provider: Judi Saa. Draper, MD   Encounter Date: 02/17/2018  PT End of Session - 02/18/18 1058    Visit Number  4    Number of Visits  8    Date for PT Re-Evaluation  03/05/18    PT Start Time  1545    PT Stop Time  1635    PT Time Calculation (min)  50 min    Activity Tolerance  Patient tolerated treatment well    Behavior During Therapy  [facility]       Past Medical History:  Diagnosis Date  . Allergy   . Bilateral bunions   . Carpal tunnel syndrome   . Dysplasia of cervix, low grade (CIN 1)    03/2016  . Eczema   . GERD (gastroesophageal reflux disease)   . History of abnormal cervical Pap smear 2009, 2010, 2013  . Somatic dysfunction of spine, thoracic   . Thoracic back pain   . Urinary tract infection    several prior  . Wears contact lenses     Past Surgical History:  Procedure Laterality Date  . COLPOSCOPY    . INTRAUTERINE DEVICE INSERTION  12/20/10   Paraguard    There were no vitals filed for this visit.  Subjective Assessment - 02/18/18 1056    Subjective  Patient reports the pain has been worse over the past few days. she is now having pain just when she sits in her car for her commute. The pain changes. It can be in the front, in her groin or her lateral hip. The pain is still mostly when she is sitting.     Limitations  Lifting;Standing;Walking    How long can you sit comfortably?  less than one hour    How long can you stand comfortably?  depends    How long can you walk comfortably?  depends    Diagnostic tests  not for lumbar or hip    Currently in Pain?  Yes    Pain Score  5     Pain Location  Hip    Pain Orientation  Right;Anterior    Pain Descriptors /  Indicators  Aching    Pain Type  Chronic pain    Pain Onset  More than a month ago    Pain Frequency  Intermittent    Aggravating Factors   dirving    Pain Relieving Factors  mobic    Multiple Pain Sites  No                       [facility] Adult PT Treatment/Exercise - 02/18/18 0001      Exercises   Exercises  Knee/Hip      Knee/Hip Exercises: Supine   Other Supine Knee/Hip Exercises  bridge with band 2x10 green    Other Supine Knee/Hip Exercises  Supine clam green 2x10       Moist Heat Therapy   Number Minutes Moist Heat  10 Minutes    Moist Heat Location  Hip      Manual Therapy   Manual Therapy  Passive ROM;Joint mobilization    Manual therapy comments  skilled palaption of trigger points; also palpated into the lumbar spine and sassessed spinal mobility. No significant deficits  noted.     Joint Mobilization  PA mobilization and inferior gleides.     Passive ROM  Rt hip rotating with passive release     Manual Traction  inferior glides of the hip to improve flexion        Trigger Point Dry Needling - 02/18/18 1053    Consent Given?  Yes    Gluteus Maximus Response  Twitch response elicited    Piriformis Response  Twitch response elicited           PT Education - 02/18/18 1057    Education Details  reviewed HEP; symptom mangement     Person(s) Educated  Patient    Methods  Explanation;Demonstration;Tactile cues;Verbal cues    Comprehension  Verbalized understanding;Returned demonstration;Verbal cues required;Tactile cues required;Need further instruction          PT Long Term Goals - 02/10/18 1008      PT LONG TERM GOAL #1   Title  Pt will be I and compliant with HEP. 4 weeks 03/05/18    Status  On-going      PT LONG TERM GOAL #2   Title  Pt will improve FOTO to less than 24% limited. 4 weeks 03/05/18    Status  On-going      PT LONG TERM GOAL #3   Title  Pt will improve Rt hip strength to 5/5 MMT. 4 weeks 03/05/18    Status  On-going       PT LONG TERM GOAL #4   Title  Pt will improve Rt hip IR by 10 deg. 4 weeks 03/05/18    Status  On-going      PT LONG TERM GOAL #5   Title  Pt will report less than 1-2/10 overall pain with usual activity and work and stairs. 4 weeks 03/05/18    Status  On-going            Plan - 02/18/18 1058    Clinical Impression Statement  Patients symptoms have not improved. she has been working on her exercises. The needling does not seem to be helping. She is presenting like a hip impingement. Therapy shifted focus to mobilizations this visit. She should feel some relief from mobilizations. She was encouraged to continu with her stretching and strengthening exercises.     Clinical Presentation  Stable    Clinical Decision Making  Low    Rehab Potential  Good    PT Frequency  2x / week    PT Duration  8 weeks    PT Treatment/Interventions  Cryotherapy;Electrical Stimulation;Iontophoresis 4mg /ml Dexamethasone;Moist Heat;Ultrasound;Therapeutic exercise;Neuromuscular re-education;Passive range of motion;Dry needling;Taping;Joint Manipulations    PT Next Visit Plan  continue with manual therapy. Assess tolerance to mobilizations     PT Home Exercise Plan  hip stretching and strengthening    Consulted and Agree with Plan of Care  Patient       Patient will benefit from skilled therapeutic intervention in order to improve the following deficits and impairments:  Decreased activity tolerance, Decreased range of motion, Decreased strength, Increased fascial restricitons, Impaired flexibility, Pain  Visit Diagnosis: Pain in right hip     Problem List Patient Active Problem List   Diagnosis Date Noted  . Right hip pain 01/24/2018  . Vision disturbance 02/15/2017  . History of anemia 02/15/2017  . Annual physical exam 01/19/2015  . Gastroesophageal reflux disease without esophagitis 01/19/2015  . Rhinitis, allergic 01/19/2015  . Other back pain 01/19/2015  . Eczema 04/25/2011  Dessie Coma  PT DPT  02/18/2018, 12:51 PM  [facility] [address] [place], [place], [address] Phone: [phone]   Fax:  [phone]  Name: Lisa Ayers MRN: [id number] Date of Birth: 07-02-1980

## 2018-02-19 ENCOUNTER — Ambulatory Visit: Payer: 59 | Admitting: Certified Nurse Midwife

## 2018-02-20 ENCOUNTER — Ambulatory Visit: Payer: 59 | Admitting: Physical Therapy

## 2018-02-20 DIAGNOSIS — M25551 Pain in right hip: Secondary | ICD-10-CM

## 2018-02-21 ENCOUNTER — Encounter: Payer: Self-pay | Admitting: Physical Therapy

## 2018-02-21 NOTE — Therapy (Signed)
[facility] [address] [place], [place], [address] Phone: [phone]   Fax:  [phone]  Physical Therapy Treatment  Patient Details  Name: Lisa Ayers MRN: [id number] Date of Birth: [birth year] Referring Provider (PT): Judi Saa, MD   Encounter Date: 02/20/2018  PT End of Session - 02/21/18 0802    Visit Number  5    Number of Visits  8    Date for PT Re-Evaluation  03/05/18    PT Start Time  1419    PT Stop Time  1503    PT Time Calculation (min)  44 min    Activity Tolerance  Patient tolerated treatment well    Behavior During Therapy  [facility] for tasks assessed/performed       Past Medical History:  Diagnosis Date  . Allergy   . Bilateral bunions   . Carpal tunnel syndrome   . Dysplasia of cervix, low grade (CIN 1)    03/2016  . Eczema   . GERD (gastroesophageal reflux disease)   . History of abnormal cervical Pap smear 2009, 2010, 2013  . Somatic dysfunction of spine, thoracic   . Thoracic back pain   . Urinary tract infection    several prior  . Wears contact lenses     Past Surgical History:  Procedure Laterality Date  . COLPOSCOPY    . INTRAUTERINE DEVICE INSERTION  12/20/10   Paraguard    There were no vitals filed for this visit.  Subjective Assessment - 02/21/18 0756    Subjective  Patient reports that her hip felt good over the past few days. She played tennis and stretched a lot. She has not been doing her exercises because she thinks they might have flaired her up.     Limitations  Lifting;Standing;Walking    How long can you sit comfortably?  less than one hour    How long can you stand comfortably?  depends    How long can you walk comfortably?  depends    Currently in Pain?  Yes    Pain Score  2     Pain Location  Hip    Pain Orientation  Right;Anterior    Pain Descriptors / Indicators  Aching    Pain Type  Chronic pain    Pain Onset  More than a month ago    Pain Frequency   Intermittent    Aggravating Factors   diving     Pain Relieving Factors  mobic                        [facility] Adult PT Treatment/Exercise - 02/21/18 0001      Exercises   Exercises  Knee/Hip      Knee/Hip Exercises: Machines for Strengthening   Other Machine  leg press 20 lbs 2x10       Knee/Hip Exercises: Standing   Other Standing Knee Exercises  single leg stance 3x20 sec hold       Manual Therapy   Manual therapy comments  skilled palaption of trigger points; also palpated into the lumbar spine and sassessed spinal mobility. No significant deficits noted.     Joint Mobilization  PA mobilization and inferior gleides.     Passive ROM  Rt hip rotating with passive release     Manual Traction  inferior glides of the hip to improve flexion        Trigger Point Dry Needling - 02/21/18 1017  Consent Given?  Yes    Gluteus Maximus Response  Twitch response elicited    Piriformis Response  Twitch response elicited           PT Education - 02/21/18 0802    Education Details  reviewed HEP, symptom mangement    Person(s) Educated  Patient    Methods  Demonstration;Tactile cues;Explanation;Verbal cues    Comprehension  Verbalized understanding;Returned demonstration;Verbal cues required;Tactile cues required          PT Long Term Goals - 02/21/18 0850      PT LONG TERM GOAL #1   Title  Pt will be I and compliant with HEP. 4 weeks 03/05/18    Time  4    Period  Weeks    Status  On-going      PT LONG TERM GOAL #2   Title  Pt will improve FOTO to less than 24% limited. 4 weeks 03/05/18    Time  4    Period  Weeks    Status  On-going      PT LONG TERM GOAL #3   Title  Pt will improve Rt hip strength to 5/5 MMT. 4 weeks 03/05/18    Time  4    Period  Weeks    Status  On-going      PT LONG TERM GOAL #4   Title  Pt will improve Rt hip IR by 10 deg. 4 weeks 03/05/18    Time  4    Period  Weeks    Status  On-going      PT LONG TERM GOAL #5   Title  Pt  will report less than 1-2/10 overall pain with usual activity and work and stairs. 4 weeks 03/05/18    Time  4    Period  Weeks    Status  On-going            Plan - 02/21/18 0848    Clinical Impression Statement  Patient became inflamed with 2 simple exercises. She also reported increased burning deep in her hip with genlte distration. She had some releif with needling. Therapy added in a light leg press and single leg stance but had to halt 2nd to a significant increase in pain. Her symptoms continue to vary. Therapy will continue to work on getting patient back into an exercise and stability program. She was encouraged to continue strethcing over the weekend. Therapy added a butterfly stretch.     Clinical Presentation  Stable    Rehab Potential  Good    PT Frequency  2x / week    PT Duration  8 weeks    PT Treatment/Interventions  Cryotherapy;Electrical Stimulation;Iontophoresis 4mg /ml Dexamethasone;Moist Heat;Ultrasound;Therapeutic exercise;Neuromuscular re-education;Passive range of motion;Dry needling;Taping;Joint Manipulations    PT Next Visit Plan  continue with manual therapy. Assess tolerance to mobilizations     PT Home Exercise Plan  hip stretching and strengthening    Consulted and Agree with Plan of Care  Patient       Patient will benefit from skilled therapeutic intervention in order to improve the following deficits and impairments:  Decreased activity tolerance, Decreased range of motion, Decreased strength, Increased fascial restricitons, Impaired flexibility, Pain  Visit Diagnosis: Pain in right hip     Problem List Patient Active Problem List   Diagnosis Date Noted  . Right hip pain 01/24/2018  . Vision disturbance 02/15/2017  . History of anemia 02/15/2017  . Annual physical exam 01/19/2015  . Gastroesophageal reflux disease without  esophagitis 01/19/2015  . Rhinitis, allergic 01/19/2015  . Other back pain 01/19/2015  . Eczema 04/25/2011    Lisa Ayers  PT DPT  02/21/2018, 10:18 AM  [facility] [address] [place], [place], [address] Phone: [phone]   Fax:  [phone]  Name: Lisa Ayers MRN: [id number] Date of Birth: 08/19/1980

## 2018-02-24 ENCOUNTER — Ambulatory Visit: Payer: 59 | Admitting: Physical Therapy

## 2018-02-24 DIAGNOSIS — M25551 Pain in right hip: Secondary | ICD-10-CM | POA: Diagnosis not present

## 2018-02-25 ENCOUNTER — Encounter: Payer: Self-pay | Admitting: Physical Therapy

## 2018-02-25 NOTE — Therapy (Signed)
[facility] [address] [place], [place], [address] Phone: [phone]   Fax:  [phone]  Physical Therapy Treatment  Patient Details  Name: Lisa Ayers MRN: [id number] Date of Birth: [birth year] Referring Provider (PT): Judi Saa, MD   Encounter Date: 02/24/2018  PT End of Session - 02/24/18 1616    Visit Number  6    Number of Visits  8    Date for PT Re-Evaluation  03/05/18    PT Start Time  1500    PT Stop Time  1546    PT Time Calculation (min)  46 min    Activity Tolerance  Patient tolerated treatment well    Behavior During Therapy  [facility] for tasks assessed/performed       Past Medical History:  Diagnosis Date  . Allergy   . Bilateral bunions   . Carpal tunnel syndrome   . Dysplasia of cervix, low grade (CIN 1)    03/2016  . Eczema   . GERD (gastroesophageal reflux disease)   . History of abnormal cervical Pap smear 2009, 2010, 2013  . Somatic dysfunction of spine, thoracic   . Thoracic back pain   . Urinary tract infection    several prior  . Wears contact lenses     Past Surgical History:  Procedure Laterality Date  . COLPOSCOPY    . INTRAUTERINE DEVICE INSERTION  12/20/10   Paraguard    There were no vitals filed for this visit.  Subjective Assessment - 02/24/18 1505    Subjective  Patient reports she played tennis, worked on Saturday, and drove to [employer] this past weekend without any increase in pain in R hip.  Believes overall symptoms in R hip has improved.     Limitations  Lifting;Standing;Walking    Currently in Pain?  No/denies                       [facility] Adult PT Treatment/Exercise - 02/25/18 0001      Exercises   Exercises  Knee/Hip;Other Exercises      Knee/Hip Exercises: Stretches   Other Knee/Hip Stretches  Adductor stretch 1x20sec      Knee/Hip Exercises: Supine   Other Supine Knee/Hip Exercises  Marching 2x10 with pt reporting pain in hip joint; bridge  without t-band 3x10; supine clamshell with red tband 3x10      Knee/Hip Exercises: Sidelying   Hip ADduction Limitations  1x10      Manual Therapy   Manual Therapy  Manual Traction    Joint Mobilization  PA mobilization and inferior gleides. Added PA glide today. No increase in pain noted.     Manual Traction  inferior glide on RLE;             PT Education - 02/24/18 1615    Education Details  reviewed anatomy of hip     Person(s) Educated  Patient    Methods  Demonstration;Explanation    Comprehension  Verbalized understanding          PT Long Term Goals - 02/21/18 0850      PT LONG TERM GOAL #1   Title  Pt will be I and compliant with HEP. 4 weeks 03/05/18    Time  4    Period  Weeks    Status  On-going      PT LONG TERM GOAL #2   Title  Pt will improve FOTO to less than 24% limited. 4 weeks  03/05/18    Time  4    Period  Weeks    Status  On-going      PT LONG TERM GOAL #3   Title  Pt will improve Rt hip strength to 5/5 MMT. 4 weeks 03/05/18    Time  4    Period  Weeks    Status  On-going      PT LONG TERM GOAL #4   Title  Pt will improve Rt hip IR by 10 deg. 4 weeks 03/05/18    Time  4    Period  Weeks    Status  On-going      PT LONG TERM GOAL #5   Title  Pt will report less than 1-2/10 overall pain with usual activity and work and stairs. 4 weeks 03/05/18    Time  4    Period  Weeks    Status  On-going            Plan - 02/24/18 1617    Clinical Impression Statement  Patient tolerated treatment well. She reported a minr increase in pain with treatment. Patient was able to tolerate 3 exercises without the pain flaring up. She was advised to perfrom these exercises at home. Therapy continues to focus on hip joint mobilization to reduce possable impingement of the hip.     Rehab Potential  Good    PT Frequency  2x / week    PT Duration  8 weeks    PT Treatment/Interventions  Cryotherapy;Electrical Stimulation;Iontophoresis 4mg /ml  Dexamethasone;Moist Heat;Ultrasound;Therapeutic exercise;Neuromuscular re-education;Passive range of motion;Dry needling;Taping;Joint Manipulations    PT Next Visit Plan  continue with manual therapy. Assess tolerance to mobilizations     PT Home Exercise Plan  hip stretching and strengthening    Consulted and Agree with Plan of Care  Patient       Patient will benefit from skilled therapeutic intervention in order to improve the following deficits and impairments:  Decreased activity tolerance, Decreased range of motion, Decreased strength, Increased fascial restricitons, Impaired flexibility, Pain  Visit Diagnosis: Pain in right hip     Problem List Patient Active Problem List   Diagnosis Date Noted  . Right hip pain 01/24/2018  . Vision disturbance 02/15/2017  . History of anemia 02/15/2017  . Annual physical exam 01/19/2015  . Gastroesophageal reflux disease without esophagitis 01/19/2015  . Rhinitis, allergic 01/19/2015  . Other back pain 01/19/2015  . Eczema 04/25/2011    Dessie Coma  PT DPT  02/25/2018, 10:37 AM  [facility] [address] [place], [place], [address] Phone: [phone]   Fax:  [phone]  Name: Lisa Ayers MRN: [id number] Date of Birth: 1980/06/27

## 2018-02-27 ENCOUNTER — Ambulatory Visit: Payer: 59 | Attending: Sports Medicine | Admitting: Physical Therapy

## 2018-02-27 DIAGNOSIS — M25551 Pain in right hip: Secondary | ICD-10-CM | POA: Insufficient documentation

## 2018-02-28 ENCOUNTER — Encounter: Payer: Self-pay | Admitting: Physical Therapy

## 2018-02-28 NOTE — Therapy (Signed)
[facility] [address] [place], [place], [address] Phone: [phone]   Fax:  [phone]  Physical Therapy Treatment  Patient Details  Name: Lisa Ayers MRN: [id number] Date of Birth: 1981-05-26 Referring Provider (PT): Judi Saa, MD   Encounter Date: 02/27/2018  PT End of Session - 02/28/18 0756    Visit Number  7    Number of Visits  8    Date for PT Re-Evaluation  03/05/18    Authorization Type  [facility] [employer]     PT Start Time  1415    PT Stop Time  1458    PT Time Calculation (min)  43 min    Activity Tolerance  Patient tolerated treatment well    Behavior During Therapy  [facility] for tasks assessed/performed       Past Medical History:  Diagnosis Date  . Allergy   . Bilateral bunions   . Carpal tunnel syndrome   . Dysplasia of cervix, low grade (CIN 1)    03/2016  . Eczema   . GERD (gastroesophageal reflux disease)   . History of abnormal cervical Pap smear 2009, 2010, 2013  . Somatic dysfunction of spine, thoracic   . Thoracic back pain   . Urinary tract infection    several prior  . Wears contact lenses     Past Surgical History:  Procedure Laterality Date  . COLPOSCOPY    . INTRAUTERINE DEVICE INSERTION  12/20/10   Paraguard    There were no vitals filed for this visit.  Subjective Assessment - 02/27/18 1420    Subjective  Patient reports she was sore on Tuesday after session. Continues to have increased pain driving to work and intermittently throughout work day. She attempts to decrease pain with HEP with some relief.     Limitations  Lifting;Standing;Walking    How long can you sit comfortably?  less than one hour    How long can you stand comfortably?  depends    How long can you walk comfortably?  depends    Diagnostic tests  not for lumbar or hip    Patient Stated Goals  pain free    Currently in Pain?  Yes    Pain Score  3     Pain Location  Hip    Pain Orientation  Right;Anterior    Pain  Descriptors / Indicators  Aching    Pain Type  Chronic pain    Pain Onset  More than a month ago    Pain Frequency  Intermittent    Aggravating Factors   diving     Pain Relieving Factors  mobic4    Multiple Pain Sites  No                       [facility] Adult PT Treatment/Exercise - 02/28/18 0001      Exercises   Exercises  Knee/Hip      Knee/Hip Exercises: Supine   Other Supine Knee/Hip Exercises  Marching 2x10 with pt reporting pain in hip joint; bridge without t-band 3x10; supine clamshell with red tband 3x10    Other Supine Knee/Hip Exercises  clam shells; reverse cruntches x20       Manual Therapy   Manual Therapy  Manual Traction    Joint Mobilization  PA mobilization and inferior gleides. Added PA glide today. No increase in pain noted.     Manual Traction  inferior glide on RLE;  Trigger Point Dry Needling - 02/28/18 0802    Consent Given?  Yes    Gluteus Maximus Response  Twitch response elicited    Gluteus Minimus Response  Twitch response elicited    Piriformis Response  Twitch response elicited           PT Education - 02/28/18 0756    Education Details  reviewed HEP and symptom management     Person(s) Educated  Patient    Methods  Explanation;Demonstration;Tactile cues;Verbal cues    Comprehension  Verbalized understanding;Returned demonstration;Verbal cues required;Tactile cues required          PT Long Term Goals - 02/28/18 0759      PT LONG TERM GOAL #1   Title  Pt will be I and compliant with HEP. 4 weeks 03/05/18    Time  4    Period  Weeks    Status  On-going      PT LONG TERM GOAL #2   Title  Pt will improve FOTO to less than 24% limited. 4 weeks 03/05/18    Time  4    Period  Weeks    Status  On-going      PT LONG TERM GOAL #3   Title  Pt will improve Rt hip strength to 5/5 MMT. 4 weeks 03/05/18    Time  4    Period  Weeks    Status  On-going      PT LONG TERM GOAL #4   Title  Pt will improve Rt hip IR by 10  deg. 4 weeks 03/05/18    Time  4    Period  Weeks    Status  On-going      PT LONG TERM GOAL #5   Title  Pt will report less than 1-2/10 overall pain with usual activity and work and stairs. 4 weeks 03/05/18    Time  4    Period  Weeks    Status  On-going            Plan - 02/28/18 0757    Clinical Impression Statement  Patient had no pain with treatment. She had a good twtich response with needling to her piriformis and glut minimis; Therapy will continue to progress as tolerated. She has good stretches but then her pain is the same as it was. Re-assess next visit.     Clinical Presentation  Stable    Clinical Decision Making  Low    Rehab Potential  Good    PT Frequency  2x / week    PT Duration  8 weeks    PT Treatment/Interventions  Cryotherapy;Electrical Stimulation;Iontophoresis 4mg /ml Dexamethasone;Moist Heat;Ultrasound;Therapeutic exercise;Neuromuscular re-education;Passive range of motion;Dry needling;Taping;Joint Manipulations    PT Next Visit Plan  continue with manual therapy. Assess tolerance to mobilizations     PT Home Exercise Plan  hip stretching and strengthening    Consulted and Agree with Plan of Care  Patient       Patient will benefit from skilled therapeutic intervention in order to improve the following deficits and impairments:  Decreased activity tolerance, Decreased range of motion, Decreased strength, Increased fascial restricitons, Impaired flexibility, Pain  Visit Diagnosis: Pain in right hip     Problem List Patient Active Problem List   Diagnosis Date Noted  . Right hip pain 01/24/2018  . Vision disturbance 02/15/2017  . History of anemia 02/15/2017  . Annual physical exam 01/19/2015  . Gastroesophageal reflux disease without esophagitis 01/19/2015  . Rhinitis, allergic 01/19/2015  .  Other back pain 01/19/2015  . Eczema 04/25/2011    Dessie Coma  PT DPT  02/28/2018, 9:14 AM  [facility] [address] [place], [place], [address] Phone: [phone]   Fax:  [phone]  Name: Lisa Ayers MRN: [id number] Date of Birth: March 30, 1981

## 2018-03-03 ENCOUNTER — Ambulatory Visit: Payer: 59 | Admitting: Physical Therapy

## 2018-03-03 ENCOUNTER — Encounter: Payer: Self-pay | Admitting: Physical Therapy

## 2018-03-03 DIAGNOSIS — M25551 Pain in right hip: Secondary | ICD-10-CM

## 2018-03-04 ENCOUNTER — Encounter: Payer: Self-pay | Admitting: Physical Therapy

## 2018-03-04 NOTE — Therapy (Signed)
[facility] [address] [place], [place], [address] Phone: [phone]   Fax:  [phone]  Physical Therapy Treatment  Patient Details  Name: Lisa Ayers MRN: [id number] Date of Birth: 11/18/1980 Referring Provider (PT): Judi Saa, MD   Encounter Date: 03/03/2018  PT End of Session - 03/03/18 1631    Visit Number  8    Number of Visits  8    Date for PT Re-Evaluation  03/05/18    Authorization Type  [facility] [employer]     PT Start Time  1500    PT Stop Time  1546    PT Time Calculation (min)  46 min    Activity Tolerance  Patient tolerated treatment well    Behavior During Therapy  [facility] for tasks assessed/performed       Past Medical History:  Diagnosis Date  . Allergy   . Bilateral bunions   . Carpal tunnel syndrome   . Dysplasia of cervix, low grade (CIN 1)    03/2016  . Eczema   . GERD (gastroesophageal reflux disease)   . History of abnormal cervical Pap smear 2009, 2010, 2013  . Somatic dysfunction of spine, thoracic   . Thoracic back pain   . Urinary tract infection    several prior  . Wears contact lenses     Past Surgical History:  Procedure Laterality Date  . COLPOSCOPY    . INTRAUTERINE DEVICE INSERTION  12/20/10   Paraguard    There were no vitals filed for this visit.  Subjective Assessment - 03/03/18 1624    Subjective  Patient got sore this weekend driving out to [facility]. She continues to report he pain is about the same. She is still exercissing and strengthening but she continuse to have the same pain that moves around.     Limitations  Lifting;Standing;Walking    How long can you sit comfortably?  less than one hour    How long can you stand comfortably?  depends    How long can you walk comfortably?  depends    Diagnostic tests  not for lumbar or hip    Patient Stated Goals  pain free    Currently in Pain?  Yes    Pain Score  2     Pain Location  Hip    Pain Orientation   Right;Anterior;Lateral    Pain Descriptors / Indicators  Aching    Pain Type  Chronic pain    Pain Onset  More than a month ago    Pain Frequency  Intermittent    Aggravating Factors   driving    Pain Relieving Factors  mobic     Multiple Pain Sites  No                       [facility] Adult PT Treatment/Exercise - 03/04/18 0001      Knee/Hip Exercises: Supine   Other Supine Knee/Hip Exercises  clam shells green x20; reverse cruntches x20; lateral trunk rotation x20;       Manual Therapy   Manual Therapy  Manual Traction    Joint Mobilization  PA mobilization and inferior gleides. Added PA glide today. No increase in pain noted.     Soft tissue mobilization  roll out rpto glute and IT band    Passive ROM  genlte PROM into all planes     Manual Traction  inferior glide on RLE;  Trigger Point Dry Needling - 03/04/18 4098    Consent Given?  Yes    Gluteus Maximus Response  Twitch response elicited    Gluteus Minimus Response  Twitch response elicited    Piriformis Response  Twitch response elicited           PT Education - 03/03/18 1626    Education Details  reviewed exercises     Person(s) Educated  Patient    Methods  Explanation;Demonstration;Tactile cues;Verbal cues    Comprehension  Verbalized understanding;Returned demonstration;Tactile cues required;Verbal cues required          PT Long Term Goals - 03/04/18 0813      PT LONG TERM GOAL #1   Title  Pt will be I and compliant with HEP. 4 weeks 03/05/18    Time  4    Period  Weeks    Status  On-going      PT LONG TERM GOAL #2   Title  Pt will improve FOTO to less than 24% limited. 4 weeks 03/05/18    Time  4    Period  Weeks    Status  On-going      PT LONG TERM GOAL #3   Title  Pt will improve Rt hip strength to 5/5 MMT. 4 weeks 03/05/18    Time  4    Period  Weeks    Status  On-going      PT LONG TERM GOAL #4   Title  Pt will improve Rt hip IR by 10 deg. 4 weeks 03/05/18    Time  4     Period  Weeks    Status  On-going      PT LONG TERM GOAL #5   Title  Pt will report less than 1-2/10 overall pain with usual activity and work and stairs. 4 weeks 03/05/18    Time  4    Period  Weeks    Status  On-going            Plan - 03/04/18 0810    Clinical Impression Statement  Patient continues to have a good twitch response to needling but very little carryover. Patient advised to make another appointment with the MD. Her symptoms continue to vary.     Clinical Presentation  Stable    Clinical Decision Making  Low    Rehab Potential  Good    PT Frequency  2x / week    PT Duration  8 weeks    PT Treatment/Interventions  Cryotherapy;Electrical Stimulation;Iontophoresis 4mg /ml Dexamethasone;Moist Heat;Ultrasound;Therapeutic exercise;Neuromuscular re-education;Passive range of motion;Dry needling;Taping;Joint Manipulations    PT Next Visit Plan  continue with manual therapy. Assess tolerance to mobilizations     PT Home Exercise Plan  hip stretching and strengthening    Consulted and Agree with Plan of Care  Patient       Patient will benefit from skilled therapeutic intervention in order to improve the following deficits and impairments:  Decreased activity tolerance, Decreased range of motion, Decreased strength, Increased fascial restricitons, Impaired flexibility, Pain  Visit Diagnosis: Pain in right hip     Problem List Patient Active Problem List   Diagnosis Date Noted  . Right hip pain 01/24/2018  . Vision disturbance 02/15/2017  . History of anemia 02/15/2017  . Annual physical exam 01/19/2015  . Gastroesophageal reflux disease without esophagitis 01/19/2015  . Rhinitis, allergic 01/19/2015  . Other back pain 01/19/2015  . Eczema 04/25/2011    Dessie Coma PT DPT  03/04/2018, 8:21 AM  [facility] [place] [facility] [address] [place], [place], [address] Phone: [phone]   Fax:  [phone]  Name: Lisa Ayers MRN: [id number] Date of Birth: 04-13-1981

## 2018-03-06 ENCOUNTER — Ambulatory Visit: Payer: 59 | Admitting: Physical Therapy

## 2018-03-06 DIAGNOSIS — M25551 Pain in right hip: Secondary | ICD-10-CM

## 2018-03-07 ENCOUNTER — Encounter: Payer: Self-pay | Admitting: Physical Therapy

## 2018-03-07 NOTE — Therapy (Signed)
[facility] [address] [place], [place], [address] Phone: [phone]   Fax:  [phone]  Physical Therapy Treatment  Patient Details  Name: Lisa Ayers MRN: [id number] Date of Birth: Jul 08, 1980 Referring Provider (PT): Judi Saa, MD   Encounter Date: 03/06/2018  PT End of Session - 03/06/18 1437    Visit Number  9    Number of Visits  17    Date for PT Re-Evaluation  04/10/18    PT Start Time  1415    PT Stop Time  1458    PT Time Calculation (min)  43 min    Activity Tolerance  Patient tolerated treatment well    Behavior During Therapy  [facility] for tasks assessed/performed       Past Medical History:  Diagnosis Date  . Allergy   . Bilateral bunions   . Carpal tunnel syndrome   . Dysplasia of cervix, low grade (CIN 1)    03/2016  . Eczema   . GERD (gastroesophageal reflux disease)   . History of abnormal cervical Pap smear 2009, 2010, 2013  . Somatic dysfunction of spine, thoracic   . Thoracic back pain   . Urinary tract infection    several prior  . Wears contact lenses     Past Surgical History:  Procedure Laterality Date  . COLPOSCOPY    . INTRAUTERINE DEVICE INSERTION  12/20/10   Paraguard    There were no vitals filed for this visit.  Subjective Assessment - 03/06/18 1424    Subjective  Patient reports some stiffness in the joint itself but feels over all that the muscles around the hipare loose and has no pain today. Was a little sore after the last session, but didn't last for more than a day. She was able to drive to [place] with only minor pain,     Currently in Pain?  No/denies    Pain Score  0-No pain                               PT Education - 03/07/18 0854    Education Details  reviewed ther-ex     Person(s) Educated  Patient    Methods  Explanation;Demonstration;Verbal cues;Tactile cues    Comprehension  Verbalized understanding;Returned demonstration;Verbal  cues required;Tactile cues required          PT Long Term Goals - 03/04/18 0813      PT LONG TERM GOAL #1   Title  Pt will be I and compliant with HEP. 4 weeks 03/05/18    Time  4    Period  Weeks    Status  On-going      PT LONG TERM GOAL #2   Title  Pt will improve FOTO to less than 24% limited. 4 weeks 03/05/18    Time  4    Period  Weeks    Status  On-going      PT LONG TERM GOAL #3   Title  Pt will improve Rt hip strength to 5/5 MMT. 4 weeks 03/05/18    Time  4    Period  Weeks    Status  On-going      PT LONG TERM GOAL #4   Title  Pt will improve Rt hip IR by 10 deg. 4 weeks 03/05/18    Time  4    Period  Weeks    Status  On-going  PT LONG TERM GOAL #5   Title  Pt will report less than 1-2/10 overall pain with usual activity and work and stairs. 4 weeks 03/05/18    Time  4    Period  Weeks    Status  On-going            Plan - 03/07/18 0855    Clinical Impression Statement  Patient continues to exhibit signs of hip labral pathology. Her pain tends to shift from her groin, to her anteriror hip, to her lateral hip depending on her hip position  Therapy has been focusing on hip mobilization focaused on hip impingement. The mobilizations and needling appear to be helping but she is still having symptoms when sitting in her car. Her hip strength has improved and she reached her FOTO goal. She would benefit from skilled therapy for 4 more weeks to continue to maximizae her functional gains and improve her ability o drive in a car. The patient was given a base return to running/ hip stabilization program today. The only exercise she had pain with was her clam shell.     Clinical Presentation  Stable    Clinical Decision Making  Low    Rehab Potential  Good    PT Frequency  2x / week    PT Duration  4 weeks    PT Treatment/Interventions  Cryotherapy;Electrical Stimulation;Iontophoresis 4mg /ml Dexamethasone;Moist Heat;Ultrasound;Therapeutic exercise;Neuromuscular  re-education;Passive range of motion;Dry needling;Taping;Joint Manipulations    PT Next Visit Plan  continue with manual therapy. Assess tolerance to mobilizations     PT Home Exercise Plan  hip stretching and strengthening    Consulted and Agree with Plan of Care  Patient       Patient will benefit from skilled therapeutic intervention in order to improve the following deficits and impairments:  Decreased activity tolerance, Decreased range of motion, Decreased strength, Increased fascial restricitons, Impaired flexibility, Pain  Visit Diagnosis: Pain in right hip     Problem List Patient Active Problem List   Diagnosis Date Noted  . Right hip pain 01/24/2018  . Vision disturbance 02/15/2017  . History of anemia 02/15/2017  . Annual physical exam 01/19/2015  . Gastroesophageal reflux disease without esophagitis 01/19/2015  . Rhinitis, allergic 01/19/2015  . Other back pain 01/19/2015  . Eczema 04/25/2011    Dessie Coma PT DPT  03/07/2018, 9:03 AM  [facility] [address] [place], [place], [address] Phone: [phone]   Fax:  [phone]  Name: Lisa Ayers MRN: [id number] Date of Birth: 01-06-1981

## 2018-03-10 ENCOUNTER — Ambulatory Visit (INDEPENDENT_AMBULATORY_CARE_PROVIDER_SITE_OTHER): Payer: 59 | Admitting: Sports Medicine

## 2018-03-10 VITALS — BP 110/70 | Ht 67.0 in | Wt 148.0 lb

## 2018-03-10 DIAGNOSIS — M25551 Pain in right hip: Secondary | ICD-10-CM | POA: Diagnosis not present

## 2018-03-10 NOTE — Progress Notes (Signed)
   Subjective:    Patient ID: Lisa Ayers, female    DOB: 07/03/1980, 37 y.o.   MRN: [id number]  HPI   Patient comes in today for follow-up on right hip pain.  She has had several physical therapy sessions.  She feels like physical therapy has been helpful but not curative.  She is increasing flexibility and strength in her hip but she still endorses pain deep within the hip with certain activities such as driving and prolonged sitting.  She also endorses catching and pain with twisting motions.  She denies numbness or tingling down the leg.  She describes the pain as being "deep within the hip".  Meloxicam was helpful but she is no longer taking it.   Review of Systems    As above Objective:   Physical Exam  Well-developed, well-nourished.  No acute distress.  Awake alert and oriented x3.  Vital signs reviewed.  Right hip: Patient has good painless hip range of motion but does have a positive FADIR. Negative FADER.  No significant tenderness to palpation.  Her previous Trendelenburg has now resolved.      Assessment & Plan:   Persistent right hip pain-rule out labral tear  Although the patient is improving with physical therapy, she is certainly not symptom-free.  Given her ongoing symptoms despite 6 weeks of conservative treatment I would like to get an MRI arthrogram of her hip specifically to rule out a labral tear.  Patient was given a list of facilities and she will call us with her preference.  I think she would benefit from continued physical therapy in the meantime.  We will delineate a more definitive long-term treatment plan based on her MRI arthrogram findings.  Patient is in agreement with this plan.  Although I had also initially considered lumbar radiculopathy, I am much less suspicious of that now.

## 2018-03-13 ENCOUNTER — Other Ambulatory Visit (HOSPITAL_COMMUNITY)
Admission: RE | Admit: 2018-03-13 | Discharge: 2018-03-13 | Disposition: A | Discharge status: Home / Self Care | Payer: 59 | Source: Ambulatory Visit | Attending: Obstetrics & Gynecology | Admitting: Obstetrics & Gynecology

## 2018-03-13 ENCOUNTER — Other Ambulatory Visit: Payer: Self-pay

## 2018-03-13 ENCOUNTER — Ambulatory Visit (INDEPENDENT_AMBULATORY_CARE_PROVIDER_SITE_OTHER): Payer: 59 | Admitting: Certified Nurse Midwife

## 2018-03-13 ENCOUNTER — Encounter: Payer: Self-pay | Admitting: Certified Nurse Midwife

## 2018-03-13 VITALS — BP 100/64 | HR 68 | Resp 16 | Ht 66.25 in | Wt 149.0 lb

## 2018-03-13 DIAGNOSIS — Z30431 Encounter for routine checking of intrauterine contraceptive device: Secondary | ICD-10-CM

## 2018-03-13 DIAGNOSIS — Z01419 Encounter for gynecological examination (general) (routine) without abnormal findings: Secondary | ICD-10-CM | POA: Diagnosis not present

## 2018-03-13 DIAGNOSIS — Z124 Encounter for screening for malignant neoplasm of cervix: Secondary | ICD-10-CM

## 2018-03-13 DIAGNOSIS — Z8742 Personal history of other diseases of the female genital tract: Secondary | ICD-10-CM

## 2018-03-13 NOTE — Patient Instructions (Signed)

## 2018-03-13 NOTE — Progress Notes (Signed)
37 y.o. G61P1001 Divorced  Caucasian Fe here for annual exam. Patient was seen 02/06/18 for DUB and [place] and was given Provera for 10 days with withdrawal bleeding and full  period 03/03/18. Some brown discharge now and will monitor to see if occurs again. No STD concerns, Had lab work done with PCP was told to start Vitamin D supplement which she is doing. No other health issues today.  Patient's last menstrual period was 03/03/2018 (exact date).          Sexually active: Yes.    The current method of family planning is Paragard IUD.    Exercising: Yes.    tennis & yoga Smoker:  no  Review of Systems  Constitutional: Negative.   HENT: Negative.   Eyes: Negative.   Respiratory: Negative.   Cardiovascular: Negative.   Gastrointestinal: Negative.   Genitourinary: Negative.   Musculoskeletal: Negative.   Skin: Negative.   Neurological: Negative.   Endo/Heme/Allergies: Negative.   Psychiatric/Behavioral: Negative.     Health Maintenance: Pap:  09-14-14 ASCUS HPV HR neg,02-06-16 ASCUS HPV HR neg colpo LGSIL, 02-14-17 neg HPV HR neg History of Abnormal Pap: yes MMG:  none Self Breast exams: no Colonoscopy:  none BMD:   none TDaP:  2010 Shingles: no Pneumonia: no Hep C and HIV: HIV neg with pregnancy Labs: no   reports that she has never smoked. She has never used smokeless tobacco. She reports that she drinks alcohol. She reports that she does not use drugs.  Past Medical History:  Diagnosis Date  . Allergy   . Bilateral bunions   . Carpal tunnel syndrome   . Dysplasia of cervix, low grade (CIN 1)    03/2016  . Eczema   . GERD (gastroesophageal reflux disease)   . History of abnormal cervical Pap smear 2009, 2010, 2013  . Somatic dysfunction of spine, thoracic   . Thoracic back pain   . Urinary tract infection    several prior  . Wears contact lenses     Past Surgical History:  Procedure Laterality Date  . COLPOSCOPY    . INTRAUTERINE DEVICE INSERTION  12/20/10   Paraguard     Current Outpatient Medications  Medication Sig Dispense Refill  . pantoprazole (PROTONIX) 40 MG tablet Take 1 tablet (40 mg total) by mouth daily. 90 tablet 3  . PARAGARD INTRAUTERINE COPPER IU by Intrauterine route.     No current facility-administered medications for this visit.     Family History  Problem Relation Age of Onset  . Depression Mother   . Diabetes Mother   . Thyroid disease Mother   . Glaucoma Mother   . Diabetes Father   . Diabetes Maternal Grandmother   . Stroke Maternal Grandmother   . Heart disease Maternal Grandmother        CABG  . Breast cancer Paternal Grandmother 75          . COPD Paternal Grandmother     ROS:  Pertinent items are noted in HPI.  Otherwise, a comprehensive ROS was negative.  Exam:   BP 100/64   Pulse 68   Resp 16   Ht 5' 6.25" (1.683 m)   Wt 149 lb (67.6 kg)   LMP 03/03/2018 (Exact Date)   BMI 23.87 kg/m  Height: 5' 6.25" (168.3 cm) Ht Readings from Last 3 Encounters:  03/13/18 5' 6.25" (1.683 m)  03/10/18 5\' 7"  (1.702 m)  02/06/18 5' 6.25" (1.683 m)    General appearance: alert, cooperative and  appears stated age Head: Normocephalic, without obvious abnormality, atraumatic Neck: no adenopathy, supple, symmetrical, trachea midline and thyroid normal to inspection and palpation Lungs: clear to auscultation bilaterally Breasts: normal appearance, no masses or tenderness, No nipple retraction or dimpling, No nipple discharge or bleeding, No axillary or supraclavicular adenopathy Heart: regular rate and rhythm Abdomen: soft, non-tender; no masses,  no organomegaly Extremities: extremities normal, atraumatic, no cyanosis or edema Skin: Skin color, texture, turgor normal. No rashes or lesions Lymph nodes: Cervical, supraclavicular, and axillary nodes normal. No abnormal inguinal nodes palpated Neurologic: Grossly normal   Pelvic: External genitalia:  no lesions              Urethra:  normal appearing urethra with no  masses, tenderness or lesions              Bartholin's and Skene's: normal                 Vagina: normal appearing vagina with normal color and discharge, no lesions              Cervix: multiparous appearance, no cervical motion tenderness, no lesions and IUD string noted in cervix              Pap taken: Yes.   Bimanual Exam:  Uterus:  normal size, contour, position, consistency, mobility, non-tender and mid position              Adnexa: normal adnexa and no mass, fullness, tenderness               Rectovaginal: Confirms               Anus:  normal sphincter tone, no lesions  Chaperone present: yes  A:  Well Woman with normal exam  Contraception Paragard IUD due for removal in 12/19/20  Positive withdrawal bleeding with Provera for spotting in between periods  History of ASCUS pap with LSIL on colpo 2017  PCP manages labs  P:   Reviewed health and wellness pertinent to exam  Aware of warning signs with IUD, will advise if spotting starts again in between cycles, may need another Provera challenge.  Continue follow up with PCP as indicated  Pap smear: yes   counseled on breast self exam, HIV risk factors and prevention, adequate intake of calcium and vitamin D, diet and exercise  return annually or prn  An After Visit Summary was printed and given to the patient.

## 2018-03-14 LAB — CYTOLOGY - PAP: Diagnosis: NEGATIVE

## 2018-03-21 ENCOUNTER — Encounter

## 2018-03-25 ENCOUNTER — Ambulatory Visit: Payer: 59 | Admitting: Physical Therapy

## 2018-03-31 ENCOUNTER — Ambulatory Visit: Payer: 59 | Attending: Sports Medicine | Admitting: Physical Therapy

## 2018-03-31 ENCOUNTER — Encounter: Payer: Self-pay | Admitting: Physical Therapy

## 2018-03-31 DIAGNOSIS — M25551 Pain in right hip: Secondary | ICD-10-CM | POA: Insufficient documentation

## 2018-03-31 NOTE — Therapy (Addendum)
[place], [place], [address] Phone: [phone]   Fax:  [phone]  Physical Therapy Treatment/Discharge   Patient Details  Name: Lisa Ayers MRN: [id number] Date of Birth: 04/25/1981 Referring Provider (PT): Renata Caprice, MD   Encounter Date: 03/31/2018  PT End of Session - 03/31/18 1801    Visit Number  10    Number of Visits  17    Date for PT Re-Evaluation  04/10/18    Authorization Type  [facility] [employer]     PT Start Time  1500    PT Stop Time  1545    PT Time Calculation (min)  45 min    Activity Tolerance  Patient tolerated treatment well    Behavior During Therapy  [facility] for tasks assessed/performed       Past Medical History:  Diagnosis Date  . Allergy   . Bilateral bunions   . Carpal tunnel syndrome   . Dysplasia of cervix, low grade (CIN 1)    03/2016  . Eczema   . GERD (gastroesophageal reflux disease)   . History of abnormal cervical Pap smear 2009, 2010, 2013  . Somatic dysfunction of spine, thoracic   . Thoracic back pain   . Urinary tract infection    several prior  . Wears contact lenses     Past Surgical History:  Procedure Laterality Date  . COLPOSCOPY    . INTRAUTERINE DEVICE INSERTION  12/20/10   Paraguard    There were no vitals filed for this visit.  Subjective Assessment - 03/31/18 1508    Subjective  Patient reports she has had a few hip flare ups over the past 3 weeks, but hasn't been bad. Feels that pain is improving. Discussed getting MRI with MD recently but believes she will put it off until the new year as pain is getting better and changing plan at work. Has not had any longer rides in the car recently but denies pain with short rides around town.     Limitations  Lifting;Standing;Walking    How long can you sit comfortably?  less than one hour    How long can you stand comfortably?  depends    How long can you walk comfortably?  depends    Diagnostic tests   not for lumbar or hip    Patient Stated Goals  pain free    Currently in Pain?  No/denies    Pain Score  0-No pain    Pain Location  Hip    Pain Onset  More than a month ago    Pain Frequency  Intermittent    Aggravating Factors   driving    Pain Relieving Factors  mobic                        [facility] Adult PT Treatment/Exercise - 03/31/18 0001      Knee/Hip Exercises: Stretches   Piriformis Stretch  2 reps;30 seconds   cues to decrease "pinching" pain anteriorly    Other Knee/Hip Stretches  thomas test stretch 2x30 secc      Knee/Hip Exercises: Aerobic   Nustep  L4x5'      Knee/Hip Exercises: Machines for Strengthening   Other Machine  leg press 20 lbs 2x10       Knee/Hip Exercises: Standing   Other Standing Knee Exercises  single leg stance pre running drill with cone taps 2x10; ;lateral band walks blue t-band 2x10;  single leg stance with ball bounce 2x5; hip marching with yellow t-band around feet 2x10      Knee/Hip Exercises: Supine   Other Supine Knee/Hip Exercises  bridges with marching 2x10    Other Supine Knee/Hip Exercises  SLR 2x10 ; ball squeezes 1x15      Knee/Hip Exercises: Sidelying   Hip ADduction Limitations  hip aBUDCTION 2x10; IR with knees together and feet seperating 2x10      Quadruped Bird dogs : 1x5 hip ext Bil; alternating opposite UE/LE 1x5 Bil       PT Education - 03/31/18 1801    Education Details  reviewed HEP    Person(s) Educated  Patient    Methods  Explanation;Demonstration;Tactile cues    Comprehension  Verbalized understanding;Returned demonstration;Verbal cues required          PT Long Term Goals - 03/31/18 1804      PT LONG TERM GOAL #1   Title  Pt will be I and compliant with HEP. 4 weeks 03/05/18    Time  4    Period  Weeks    Status  On-going      PT LONG TERM GOAL #2   Title  Pt will improve FOTO to less than 24% limited. 4 weeks 03/05/18    Time  4    Period  Weeks    Status  On-going      PT LONG  TERM GOAL #3   Title  Pt will improve Rt hip strength to 5/5 MMT. 4 weeks 03/05/18    Period  Weeks    Status  On-going      PT LONG TERM GOAL #4   Title  Pt will improve Rt hip IR by 10 deg. 4 weeks 03/05/18    Time  4    Period  Weeks    Status  On-going      PT LONG TERM GOAL #5   Title  Pt will report less than 1-2/10 overall pain with usual activity and work and stairs. 4 weeks 03/05/18    Time  4    Period  Weeks    Status  On-going            Plan - 03/31/18 1801    Clinical Impression Statement  Patient tolerated treatment well. Reports pain in anterior hip with right external rotation and adduction mainly. Stiffness in right buttock region has not been an issue for her much lately. Therapy focused on progressing hip strengthening exercises. Able to tolerate leg presses without any hip pain, which she was not able to a month or so ago.     Clinical Presentation  Stable    Clinical Decision Making  Low    Rehab Potential  Good    PT Frequency  2x / week    PT Duration  4 weeks    PT Treatment/Interventions  Cryotherapy;Electrical Stimulation;Iontophoresis 19m/ml Dexamethasone;Moist Heat;Ultrasound;Therapeutic exercise;Neuromuscular re-education;Passive range of motion;Dry needling;Taping;Joint Manipulations    PT Next Visit Plan  continue with manual therapy. Assess tolerance to mobilizations ; progress hip strengthening exercise    PT Home Exercise Plan  hip stretching and strengthening    Consulted and Agree with Plan of Care  Patient       Patient will benefit from skilled therapeutic intervention in order to improve the following deficits and impairments:  Decreased activity tolerance, Decreased range of motion, Decreased strength, Increased fascial restricitons, Impaired flexibility, Pain  Visit Diagnosis: Pain in right hip  PHYSICAL THERAPY  DISCHARGE SUMMARY  Visits from Start of Care: 10  Current functional level related to goals / functional  outcomes: Improved pain but still having pain    Remaining deficits: Still having intermittent pain    Education / Equipment:   Plan: Patient agrees to discharge.  Patient goals were partially met. Patient is being discharged due to lack of progress.  ?????       Problem List Patient Active Problem List   Diagnosis Date Noted  . Right hip pain 01/24/2018  . Vision disturbance 02/15/2017  . History of anemia 02/15/2017  . Annual physical exam 01/19/2015  . Gastroesophageal reflux disease without esophagitis 01/19/2015  . Rhinitis, allergic 01/19/2015  . Other back pain 01/19/2015  . Eczema 04/25/2011   Carolyne Littles PT DPT  03/31/2018   Einar Crow SPT 03/31/2018, 6:07 PM During this treatment session, the therapist was present, participating in and directing the treatment.   [place], [place], [address] Phone: [phone]   Fax:  [phone]  Name: Lisa Ayers MRN: [id number] Date of Birth: 1981-05-22

## 2018-04-07 ENCOUNTER — Ambulatory Visit: Payer: 59 | Admitting: Physical Therapy

## 2018-04-14 ENCOUNTER — Ambulatory Visit: Payer: 59 | Admitting: Physical Therapy

## 2018-04-17 ENCOUNTER — Other Ambulatory Visit: Payer: Self-pay | Admitting: Medical

## 2018-04-17 MED FILL — PANTOPRAZOLE SOD DR 40 MG T: 40 | 30 days supply | Qty: 30 | Fill #0

## 2018-05-23 ENCOUNTER — Telehealth: Payer: Self-pay

## 2018-05-23 MED ORDER — PANTOPRAZOLE SODIUM 40 MG PO TBEC: 40.0000 mg | DELAYED_RELEASE_TABLET | Freq: Every day | ORAL | 0 refills | Status: DC

## 2018-05-23 MED FILL — PANTOPRAZOLE SOD DR 40 MG T: 40 | 30 days supply | Qty: 30 | Fill #0

## 2018-05-23 NOTE — Telephone Encounter (Signed)
Patient has scheduled a physical with Shane for 06/13/2018. Refilling Protonix for 30 days

## 2018-06-13 ENCOUNTER — Ambulatory Visit (INDEPENDENT_AMBULATORY_CARE_PROVIDER_SITE_OTHER): Payer: No Typology Code available for payment source | Admitting: Medical

## 2018-06-13 ENCOUNTER — Encounter: Payer: Self-pay | Admitting: Medical

## 2018-06-13 VITALS — BP 110/70 | HR 56 | Temp 97.9°F | Ht 66.0 in | Wt 157.0 lb

## 2018-06-13 DIAGNOSIS — M549 Dorsalgia, unspecified: Secondary | ICD-10-CM

## 2018-06-13 DIAGNOSIS — M25551 Pain in right hip: Secondary | ICD-10-CM

## 2018-06-13 DIAGNOSIS — Z0000 Encounter for general adult medical examination without abnormal findings: Secondary | ICD-10-CM

## 2018-06-13 DIAGNOSIS — E559 Vitamin D deficiency, unspecified: Secondary | ICD-10-CM | POA: Diagnosis not present

## 2018-06-13 DIAGNOSIS — R748 Abnormal levels of other serum enzymes: Secondary | ICD-10-CM | POA: Insufficient documentation

## 2018-06-13 DIAGNOSIS — Z862 Personal history of diseases of the blood and blood-forming organs and certain disorders involving the immune mechanism: Secondary | ICD-10-CM

## 2018-06-13 DIAGNOSIS — Z23 Encounter for immunization: Secondary | ICD-10-CM | POA: Insufficient documentation

## 2018-06-13 DIAGNOSIS — L309 Dermatitis, unspecified: Secondary | ICD-10-CM

## 2018-06-13 DIAGNOSIS — G8929 Other chronic pain: Secondary | ICD-10-CM

## 2018-06-13 DIAGNOSIS — K219 Gastro-esophageal reflux disease without esophagitis: Secondary | ICD-10-CM

## 2018-06-13 LAB — POCT URINALYSIS DIP (PROADVANTAGE DEVICE)
Bilirubin, UA: NEGATIVE
Glucose, UA: NEGATIVE mg/dL
Ketones, POC UA: NEGATIVE mg/dL
Leukocytes, UA: NEGATIVE
Nitrite, UA: NEGATIVE
Protein Ur, POC: NEGATIVE mg/dL
Specific Gravity, Urine: 1.03
Urobilinogen, Ur: NEGATIVE
pH, UA: 5.5 (ref 5.0–8.0)

## 2018-06-13 NOTE — Progress Notes (Signed)
Subjective: Chief Complaint  Patient presents with  . Annual Exam   Medical team: Was seeing Idamae Lusher and [facility],  Now seeing Leota Sauers, CNM, gynecology Dr. Reino Bellis, sports medicine Dr. Stan Head, GI Tysinger, Kermit Balo, PA-C here for primary care  Concerns: Ongoing intermittent right hip pain and back pain.   Has seen sports medicine in recent months about the hip.  Was advised she may need right hip MRI.  She denies prior imaging of back and hip.  Few months ago had dry needling and PT with hip and back pain, but not any better.  Uses meloxicam prn.   Using protonix daily.  Sees Dr. Leone Payor.  No prior EGD, he didn't feel she needed EGD.   She is aware of potential risks of PPI, but can't go without.  Avoids GERD triggers in general.  Has been having some migraines of late.    Changed insurance to focus plan, will needed coordinated care for referrals.    Reviewed their medical, surgical, family, social, medication, and allergy history and updated chart as appropriate.  Past Medical History:  Diagnosis Date  . Allergy   . Bilateral bunions   . Carpal tunnel syndrome   . Dysplasia of cervix, low grade (CIN 1)    03/2016  . Eczema   . GERD (gastroesophageal reflux disease)   . History of abnormal cervical Pap smear 2009, 2010, 2013  . Somatic dysfunction of spine, thoracic   . Thoracic back pain   . Urinary tract infection    several prior  . Wears contact lenses     Past Surgical History:  Procedure Laterality Date  . COLPOSCOPY    . INTRAUTERINE DEVICE INSERTION  12/20/10   Paraguard    Social History   Socioeconomic History  . Marital status: Divorced    Spouse name: Not on file  . Number of children: 1  . Years of education: Not on file  . Highest education level: Not on file  Occupational History  . Occupation: Audiological scientist: [employer]  Social Needs  . Financial resource strain: Not on file  . Food  insecurity:    Worry: Not on file    Inability: Not on file  . Transportation needs:    Medical: Not on file    Non-medical: Not on file  Tobacco Use  . Smoking status: Never Smoker  . Smokeless tobacco: Never Used  Substance and Sexual Activity  . Alcohol use: Yes    Alcohol/week: 3.0 standard drinks    Types: 3 Cans of beer per week  . Drug use: No  . Sexual activity: Yes    Partners: Male    Birth control/protection: I.U.D.  Lifestyle  . Physical activity:    Days per week: Not on file    Minutes per session: Not on file  . Stress: Not on file  Relationships  . Social connections:    Talks on phone: Not on file    Gets together: Not on file    Attends religious service: Not on file    Active member of club or organization: Not on file    Attends meetings of clubs or organizations: Not on file    Relationship status: Not on file  . Intimate partner violence:    Fear of current or ex partner: Not on file    Emotionally abused: Not on file    Physically abused: Not on file    Forced  sexual activity: Not on file  Other Topics Concern  . Not on file  Social History Narrative   Lives with her husband and her daughter Golden HurterKatie Mae, 1 dog.  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  Runs, likes to read.  Agnostic.  05/2018    Family History  Problem Relation Age of Onset  . Depression Mother   . Diabetes Mother   . Thyroid disease Mother   . Glaucoma Mother   . Peripheral vascular disease Mother   . Diabetes Father   . Diabetes Maternal Grandmother   . Stroke Maternal Grandmother   . Heart disease Maternal Grandmother        CABG  . Breast cancer Paternal Grandmother [id number]          . COPD Paternal Grandmother      Current Outpatient Medications:  .  Ascorbic Acid (VITAMIN C PO), Take by mouth., Disp: , Rfl:  .  CALCIUM PO, Take by mouth., Disp: , Rfl:  .  Cholecalciferol (VITAMIN D3 PO), Take by mouth., Disp: , Rfl:  .  IRON PO, Take by mouth., Disp: , Rfl:  .  meloxicam  (MOBIC) 15 MG tablet, Take 15 mg by mouth daily., Disp: , Rfl:  .  pantoprazole (PROTONIX) 40 MG tablet, Take 1 tablet (40 mg total) by mouth daily., Disp: 30 tablet, Rfl: 0 .  PARAGARD INTRAUTERINE COPPER IU, by Intrauterine route., Disp: , Rfl:   Allergies  Allergen Reactions  . Augmentin [Amoxicillin-Pot Clavulanate]     Upset stomach  . Sulfur Swelling    Of tongue.     Review of Systems Constitutional: -fever, -chills, -sweats, -unexpected weight change, -decreased appetite, -fatigue Allergy: -sneezing, -itching, -congestion Dermatology: -changing moles, --rash, -lumps ENT: -runny nose, -ear pain, -sore throat, -hoarseness, -sinus pain, -teeth pain, - ringing in ears, -hearing loss, -nosebleeds Cardiology: -chest pain, -palpitations, -swelling, -difficulty breathing when lying flat, -waking up short of breath Respiratory: -cough, -shortness of breath, -difficulty breathing with exercise or exertion, -wheezing, -coughing up blood Gastroenterology: -abdominal pain, -nausea, -vomiting, -diarrhea, -constipation, -blood in stool, -changes in bowel movement, -difficulty swallowing or eating Hematology: -bleeding, -bruising  Musculoskeletal: +joint aches, -muscle aches, -joint swelling, +back pain, -neck pain, -cramping, -changes in gait Ophthalmology: denies vision changes, eye redness, itching, discharge Urology: -burning with urination, -difficulty urinating, -blood in urine, -urinary frequency, -urgency, -incontinence Neurology: -headache, -weakness, -tingling, -numbness, -memory loss, -falls, -dizziness Psychology: -depressed mood, -agitation, -sleep problems Breast/gyn: -breast tenderness, -discharge, -lumps, -vaginal discharge,- irregular periods, -heavy periods     Objective:  BP 110/70   Pulse (!) 56   Temp 97.9 F (36.6 C) (Oral)   Ht 5\' 6"  (1.676 m)   Wt 157 lb (71.2 kg)   SpO2 99%   BMI 25.34 kg/m   General appearance: alert, no distress, WD/WN, Caucasian  female Skin: unremarkable, no worrisome lesions HEENT: normocephalic, conjunctiva/corneas normal, sclerae anicteric, PERRLA, EOMi, nares patent, no discharge or erythema, pharynx normal Oral cavity: MMM, tongue normal, teeth normal Neck: supple, no lymphadenopathy, no thyromegaly, no masses, normal ROM, no bruits Chest: non tender, normal shape and expansion Heart: RRR, normal S1, S2, no murmurs Lungs: CTA bilaterally, no wheezes, rhonchi, or rales Abdomen: +bs, soft, mild epigastric tenderness, otherwise non tender, non distended, no masses, no hepatomegaly, no splenomegaly, no bruits Back: non tender, normal ROM, no scoliosis Musculoskeletal: upper extremities non tender, no obvious deformity, normal ROM throughout, lower extremities non tender, no obvious deformity, normal ROM throughout Extremities: no edema, no cyanosis, no  clubbing Pulses: 2+ symmetric, upper and lower extremities, normal cap refill Neurological: alert, oriented x 3, CN2-12 intact, strength normal upper extremities and lower extremities, sensation normal throughout, DTRs 2+ throughout, no cerebellar signs, gait normal Psychiatric: normal affect, behavior normal, pleasant  Breast/gyn/rectal - deferred to gynecology     Assessment and Plan :   Encounter Diagnoses  Name Primary?  . Encounter for health maintenance examination in adult Yes  . Alkaline phosphatase elevation   . Vitamin D deficiency   . Chronic back pain, unspecified back location, unspecified back pain laterality   . Need for Td vaccine   . Right hip pain   . Gastroesophageal reflux disease without esophagitis   . Eczema, unspecified type   . History of anemia     Physical exam - discussed and counseled on healthy lifestyle, diet, exercise, preventative care, vaccinations, sick and well care, proper use of emergency dept and after hours care, and addressed their concerns.    Health screening: Advised they see their eye doctor yearly for routine  vision care. Advised they see their dentist yearly for routine dental care including hygiene visits twice yearly.  Cancer screening Counseled on self breast exams, mammograms, cervical cancer screening  Vaccinations: Advised yearly influenza vaccine Counseled on the Td (tetanus, diptheria) vaccine.  Vaccine information sheet given. Td vaccine given after consent obtained.  Acute issues discussed: none  Separate significant chronic issues discussed: ALP elevated - additional labs today, suspected related to low Vit D.   Vit D deficiency - c/t supplement, counseled on dietary sources of Vit D, sun exposure  chronic back pain - f/u with ortho, consider baseline L spine imaging  Right hip pain - f/u with ortho, consider MRI  GERD - prior eval with GI, advised to stay on PPI.  She is aware of long term risks of PPI as discussed  Migraines - not frequent, but increasing of late.  She will keep headache diary, will f/u with eye doctor, and will avoid potential trigggers   Darika was seen today for annual exam.  Diagnoses and all orders for this visit:  Encounter for health maintenance examination in adult -     POCT Urinalysis DIP (Proadvantage Device) -     Alkaline Phosphatase, Isoenzymes -     VITAMIN D 25 Hydroxy (Vit-D Deficiency, Fractures) -     Iron -     Cancel: Lipid panel; Future -     Lipid panel  Alkaline phosphatase elevation -     Alkaline Phosphatase, Isoenzymes  Vitamin D deficiency -     VITAMIN D 25 Hydroxy (Vit-D Deficiency, Fractures)  Chronic back pain, unspecified back location, unspecified back pain laterality  Need for Td vaccine  Right hip pain  Gastroesophageal reflux disease without esophagitis  Eczema, unspecified type  History of anemia  Other orders -     Td : Tetanus/diphtheria >7yo Preservative  free    Follow-up pending labs, yearly for physical

## 2018-06-14 LAB — LIPID PANEL
Chol/HDL Ratio: 2 ratio (ref 0.0–4.4)
Cholesterol, Total: 177 mg/dL (ref 100–199)
HDL: 89 mg/dL (ref >39)
LDL Calculated: 80 mg/dL (ref 0–99)
Triglycerides: 41 mg/dL (ref 0–149)
VLDL Cholesterol Cal: 8 mg/dL (ref 5–40)

## 2018-06-15 LAB — ALKALINE PHOSPHATASE, ISOENZYMES
Alkaline Phosphatase: 163 IU/L — ABNORMAL HIGH (ref 39–117)
BONE FRACTION: 27 % (ref 14–68)
INTESTINAL FRAC.: 17 % (ref 0–18)
LIVER FRACTION: 56 % (ref 18–85)

## 2018-06-15 LAB — IRON: Iron: 91 ug/dL (ref 27–159)

## 2018-06-15 LAB — VITAMIN D 25 HYDROXY (VIT D DEFICIENCY, FRACTURES): Vit D, 25-Hydroxy: 34.5 ng/mL (ref 30.0–100.0)

## 2018-06-24 ENCOUNTER — Other Ambulatory Visit: Payer: Self-pay | Admitting: Medical

## 2018-06-24 MED FILL — PANTOPRAZOLE SOD DR 40 MG T: 40 | 90 days supply | Qty: 90 | Fill #0

## 2018-07-23 ENCOUNTER — Other Ambulatory Visit: Payer: Self-pay | Admitting: Medical

## 2018-07-23 DIAGNOSIS — M549 Dorsalgia, unspecified: Principal | ICD-10-CM

## 2018-07-23 DIAGNOSIS — G8929 Other chronic pain: Secondary | ICD-10-CM

## 2018-08-05 ENCOUNTER — Encounter: Payer: Self-pay | Admitting: Physical Therapy

## 2018-08-05 ENCOUNTER — Ambulatory Visit
Admission: RE | Admit: 2018-08-05 | Discharge: 2018-08-05 | Disposition: A | Discharge status: Home / Self Care | Payer: No Typology Code available for payment source | Source: Ambulatory Visit | Attending: Medical | Admitting: Medical

## 2018-08-05 DIAGNOSIS — G8929 Other chronic pain: Secondary | ICD-10-CM

## 2018-08-05 DIAGNOSIS — M549 Dorsalgia, unspecified: Principal | ICD-10-CM

## 2018-08-06 ENCOUNTER — Other Ambulatory Visit: Payer: Self-pay | Admitting: Medical

## 2018-08-06 ENCOUNTER — Telehealth: Payer: Self-pay | Admitting: Medical

## 2018-08-06 DIAGNOSIS — M549 Dorsalgia, unspecified: Principal | ICD-10-CM

## 2018-08-06 DIAGNOSIS — G8929 Other chronic pain: Secondary | ICD-10-CM

## 2018-08-06 NOTE — Telephone Encounter (Signed)
Set up for lumbar MRI

## 2018-08-06 NOTE — Telephone Encounter (Signed)
Referral put in epic.  

## 2018-08-07 ENCOUNTER — Telehealth: Payer: No Typology Code available for payment source | Admitting: Physician Assistant

## 2018-08-07 DIAGNOSIS — H1030 Unspecified acute conjunctivitis, unspecified eye: Secondary | ICD-10-CM | POA: Diagnosis not present

## 2018-08-07 MED ORDER — POLYMYXIN B-TRIMETHOPRIM 10000-0.1 UNIT/ML-% OP SOLN: OPHTHALMIC | 0 refills | Status: DC

## 2018-08-07 NOTE — Progress Notes (Signed)

## 2018-08-07 NOTE — Progress Notes (Signed)
I have spent 5 minutes in review of e-visit questionnaire, review and updating patient chart, medical decision making and response to patient.   Ordean Fouts Cody Nawal Burling, PA-C    

## 2018-08-15 ENCOUNTER — Ambulatory Visit: Payer: No Typology Code available for payment source

## 2018-08-15 ENCOUNTER — Ambulatory Visit
Admission: RE | Admit: 2018-08-15 | Discharge: 2018-08-15 | Disposition: A | Discharge status: Home / Self Care | Payer: No Typology Code available for payment source | Source: Ambulatory Visit | Attending: Medical | Admitting: Medical

## 2018-08-15 ENCOUNTER — Telehealth: Payer: Self-pay | Admitting: Medical

## 2018-08-15 ENCOUNTER — Ambulatory Visit (INDEPENDENT_AMBULATORY_CARE_PROVIDER_SITE_OTHER): Payer: No Typology Code available for payment source | Admitting: Medical

## 2018-08-15 ENCOUNTER — Other Ambulatory Visit: Payer: Self-pay

## 2018-08-15 ENCOUNTER — Encounter: Payer: Self-pay | Admitting: Medical

## 2018-08-15 VITALS — BP 120/70 | HR 71 | Temp 98.1°F | Resp 16 | Ht 67.0 in | Wt 153.0 lb

## 2018-08-15 DIAGNOSIS — R6889 Other general symptoms and signs: Secondary | ICD-10-CM

## 2018-08-15 DIAGNOSIS — G8929 Other chronic pain: Secondary | ICD-10-CM

## 2018-08-15 DIAGNOSIS — R05 Cough: Secondary | ICD-10-CM | POA: Diagnosis not present

## 2018-08-15 DIAGNOSIS — M549 Dorsalgia, unspecified: Principal | ICD-10-CM

## 2018-08-15 DIAGNOSIS — R52 Pain, unspecified: Secondary | ICD-10-CM | POA: Diagnosis not present

## 2018-08-15 DIAGNOSIS — R059 Cough, unspecified: Secondary | ICD-10-CM

## 2018-08-15 LAB — POCT INFLUENZA A/B
Influenza A, POC: NEGATIVE
Influenza B, POC: NEGATIVE

## 2018-08-15 MED ORDER — BENZONATATE 200 MG PO CAPS: 200.0000 mg | ORAL_CAPSULE | Freq: Three times a day (TID) | ORAL | 0 refills | Status: DC | PRN

## 2018-08-15 MED FILL — BENZONATATE 200 MG CAPS: 200 | 10 days supply | Qty: 30 | Fill #0

## 2018-08-15 NOTE — Telephone Encounter (Signed)
Pt came in for appt after I talked with her on the phone

## 2018-08-15 NOTE — Telephone Encounter (Signed)
Pt called concerning symptoms. This pt works at [employer]. They do have a pt that has been tested but results are not back. Pt has a terrible cough, headache and sore throat. She states she is having some chills and chest tightness. She has not traveled.

## 2018-08-15 NOTE — Progress Notes (Signed)
  Subjective:     Patient ID: Lisa Ayers, female   DOB: Feb 01, 1981, 38 y.o.   MRN: [id number]  HPI Chief Complaint  Patient presents with  . flu   Here for 2-day history of cough somewhat wet cough, tickle in the throat, headache, irritated throat feels somewhat swollen, somewhat of a barky cough at times.  She had a cold 3 weeks ago but this mostly resolved so she does not think this is related to that illness.  She denies runny nose, sneezing, no watery eyes, no nasal stuffiness or sinus pressure.  She is taking Benadryl and Zyrtec.  She denies fever, denies shortness of breath.  No recent travel.  She has had some chills.  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  There is 1 patient of interest in the ICU that was tested for coronavirus and still pending results but she has not had contact with that patient.  She and the other workers have been using PPE appropriately.  No other aggravating or relieving factors.  No other complaint.  Review of Systems As in subjective     Objective:   Physical Exam BP 120/70   Pulse 71   Temp 98.1 F (36.7 C) (Oral)   Resp 16   Ht 5\' 7"  (1.702 m)   Wt 153 lb (69.4 kg)   SpO2 98%   BMI 23.96 kg/m   General appearance: alert, no distress, WD/WN,  HEENT: normocephalic, sclerae anicteric, TMs flat, nares patent, no discharge,+ erythema, pharynx normal Oral cavity: MMM, no lesions Neck: supple, no lymphadenopathy, no thyromegaly, no masses Heart: RRR, normal S1, S2, no murmurs Lungs: CTA bilaterally, no wheezes, rhonchi, or rales       Assessment:     Encounter Diagnoses  Name Primary?  . Cough Yes  . Flu-like symptoms   . Body aches        Plan:     Flu negative  Exam and symptoms suggest viral respiratory tract infection.  Can use Tessalon Perles as below, discussed supportive care, rest, hydration, discussed preventative measures and.  Of contagion.  Follow-up if not much improved within the next 3 to 4 days  Lisa Ayers was  seen today for flu.  Diagnoses and all orders for this visit:  Cough  Flu-like symptoms -     Influenza A/B  Body aches  Other orders -     benzonatate (TESSALON) 200 MG capsule; Take 1 capsule (200 mg total) by mouth 3 (three) times daily as needed for cough.

## 2018-09-04 ENCOUNTER — Telehealth: Payer: No Typology Code available for payment source | Admitting: Family

## 2018-09-04 DIAGNOSIS — B9689 Other specified bacterial agents as the cause of diseases classified elsewhere: Secondary | ICD-10-CM | POA: Diagnosis not present

## 2018-09-04 DIAGNOSIS — J019 Acute sinusitis, unspecified: Secondary | ICD-10-CM | POA: Diagnosis not present

## 2018-09-04 MED ORDER — DOXYCYCLINE HYCLATE 100 MG PO TABS: 100.0000 mg | ORAL_TABLET | Freq: Two times a day (BID) | ORAL | 0 refills | Status: DC

## 2018-09-04 NOTE — Progress Notes (Signed)

## 2018-09-19 MED FILL — PANTOPRAZOLE SOD DR 40 MG T: 40 | 90 days supply | Qty: 90 | Fill #0

## 2018-10-10 ENCOUNTER — Telehealth: Payer: Self-pay | Admitting: Medical

## 2018-10-10 NOTE — Telephone Encounter (Signed)
Pt left message that she finally got appt with Neurosurgeon, she has appt with Dr. Danielle Dess for 11/05/18, pt has [employer] and needs referral.

## 2018-10-13 ENCOUNTER — Telehealth: Payer: Self-pay | Admitting: *Deleted

## 2018-10-13 NOTE — Telephone Encounter (Signed)
PCP is ok with patient seeing neurosurgeon, Dr Danielle Dess on 11/05/2018-referral for insurance.

## 2018-10-13 NOTE — Telephone Encounter (Signed)
Done

## 2018-10-13 NOTE — Telephone Encounter (Signed)
Please send referral to neurosurgery for [employer] purposes.  RE: radicular pain, abnormal MRI

## 2018-11-13 ENCOUNTER — Other Ambulatory Visit: Payer: Self-pay | Admitting: Neurological Surgery

## 2018-11-13 DIAGNOSIS — M5126 Other intervertebral disc displacement, lumbar region: Secondary | ICD-10-CM

## 2018-11-13 DIAGNOSIS — M5136 Other intervertebral disc degeneration, lumbar region: Secondary | ICD-10-CM

## 2018-12-01 ENCOUNTER — Ambulatory Visit
Admission: RE | Admit: 2018-12-01 | Discharge: 2018-12-01 | Disposition: A | Discharge status: Home / Self Care | Payer: No Typology Code available for payment source | Source: Ambulatory Visit | Attending: Neurological Surgery | Admitting: Neurological Surgery

## 2018-12-01 ENCOUNTER — Other Ambulatory Visit: Payer: Self-pay

## 2018-12-01 DIAGNOSIS — M5136 Other intervertebral disc degeneration, lumbar region: Secondary | ICD-10-CM

## 2018-12-01 DIAGNOSIS — M5126 Other intervertebral disc displacement, lumbar region: Secondary | ICD-10-CM

## 2018-12-01 MED ORDER — IOPAMIDOL (ISOVUE-M 200) INJECTION 41%
1.0000 mL | Freq: Once | INTRAMUSCULAR | Status: AC
  Administered 2018-12-01: 1 mL via EPIDURAL

## 2018-12-01 MED ORDER — METHYLPREDNISOLONE ACETATE 40 MG/ML INJ SUSP (RADIOLOG
120.0000 mg | Freq: Once | INTRAMUSCULAR | Status: AC
  Administered 2018-12-01: 14:00:00 120 mg via EPIDURAL

## 2018-12-01 NOTE — Discharge Instructions (Signed)

## 2018-12-24 MED FILL — PANTOPRAZOLE SOD DR 40 MG T: 40 | 90 days supply | Qty: 90 | Fill #0

## 2019-01-05 MED FILL — PANTOPRAZOLE SOD DR 40 MG T: 40 | 90 days supply | Qty: 90 | Fill #0

## 2019-01-20 ENCOUNTER — Telehealth: Payer: Self-pay

## 2019-01-20 NOTE — Telephone Encounter (Signed)
Pt called stating that she would like a referral to dermatology for peeling around her nose. Would you like her to be seen before being referred?

## 2019-01-20 NOTE — Telephone Encounter (Signed)
Schedule visit, in person or virtual first.   There are some things we can probably do to help

## 2019-01-22 ENCOUNTER — Encounter: Payer: Self-pay | Admitting: Medical

## 2019-01-22 ENCOUNTER — Ambulatory Visit (INDEPENDENT_AMBULATORY_CARE_PROVIDER_SITE_OTHER): Payer: No Typology Code available for payment source | Admitting: Medical

## 2019-01-22 ENCOUNTER — Other Ambulatory Visit: Payer: Self-pay

## 2019-01-22 VITALS — BP 110/70 | HR 68 | Temp 98.1°F | Ht 67.0 in | Wt 160.4 lb

## 2019-01-22 DIAGNOSIS — R238 Other skin changes: Secondary | ICD-10-CM

## 2019-01-22 DIAGNOSIS — L309 Dermatitis, unspecified: Secondary | ICD-10-CM

## 2019-01-22 DIAGNOSIS — Z803 Family history of malignant neoplasm of breast: Secondary | ICD-10-CM

## 2019-01-22 NOTE — Telephone Encounter (Signed)
Patient was seen today.

## 2019-01-23 ENCOUNTER — Encounter: Payer: Self-pay | Admitting: Medical

## 2019-01-23 DIAGNOSIS — Z803 Family history of malignant neoplasm of breast: Secondary | ICD-10-CM | POA: Insufficient documentation

## 2019-01-23 DIAGNOSIS — R238 Other skin changes: Secondary | ICD-10-CM | POA: Insufficient documentation

## 2019-01-23 MED ORDER — EUCRISA 2 % EX OINT: 1.0000 "application " | TOPICAL_OINTMENT | Freq: Every day | CUTANEOUS | 0 refills | Status: DC

## 2019-01-23 NOTE — Progress Notes (Signed)
Subjective:  Lisa Ayers is a 38 y.o. female who presents for Chief Complaint  Patient presents with  . Nose Problem    peeling around nose x1 year      Here for itching and redness around the folds of her nose bilaterally ongoing for over a year.  Has tried over-the-counter steroid that did not help.  Uses Cetaphil facial wash and an over-the-counter moisturizing hydration cream daily.  She feels like her face gets oily in general.  Lately though in the last few weeks she has a discoloration/whitish patches on both cheeks beside the nose worried about fungus.  No history of rosacea in the family, no personal history of eczema or psoriasis..  No other aggravating or relieving factors.    She recently learned that 2 of her cousins were diagnosed with breast cancer in her 33s, one BRCA positive and estrogen positive.  Her maternal grandmother also has a history of breast cancer.  She wonders about whether she should have gene testing and mammogram.  She sees gynecology and has her yearly GYN visit coming up soon.  No other c/o.  The following portions of the patient's history were reviewed and updated as appropriate: allergies, current medications, past family history, past medical history, past social history, past surgical history and problem list.  ROS Otherwise as in subjective above  Objective: BP 110/70   Pulse 68   Temp 98.1 F (36.7 C) (Oral)   Ht 5' 7"  (1.702 m)   Wt 160 lb 6.4 oz (72.8 kg)   SpO2 98%   BMI 25.12 kg/m   General appearance: alert, no distress, well developed, well nourished Skin:   There is somewhat rough texture of the nasal folds of the nares bilaterally, mild pink-red coloration, but the adjacent cheeks bilateral have a whitish hypopigmented appearance.  No other skin abnormalities of the face noted    Assessment: Encounter Diagnoses  Name Primary?  . Skin irritation Yes  . Dermatitis   . Family history of breast cancer      Plan: We  discussed her skin issue.  We discussed the possibility of fungal issue versus eczema or other diagnoses.  I gave her a tube of Eucrisa sample to try first.  If not seeing any improvement with this she will try Lamisil over-the-counter given the potential for fungus.  Continue daily facial wash.  If not improving call back and we will refer to dermatology  Advised that she call insurance to check on coverage for mammogram and gene testing given her family history.  She sees gynecology soon for yearly physical as well and they can discuss further.  Continue self breast exams.  I would recommend she go ahead and do a baseline mammogram now  Lisa Ayers was seen today for nose problem.  Diagnoses and all orders for this visit:  Skin irritation  Dermatitis  Family history of breast cancer    Follow up: pending call back

## 2019-02-10 ENCOUNTER — Telehealth: Payer: Self-pay | Admitting: Medical

## 2019-02-10 DIAGNOSIS — R238 Other skin changes: Secondary | ICD-10-CM

## 2019-02-10 DIAGNOSIS — L309 Dermatitis, unspecified: Secondary | ICD-10-CM

## 2019-02-10 NOTE — Telephone Encounter (Signed)
Pt called she wants to go ahead and get referral to dermatology

## 2019-02-11 NOTE — Telephone Encounter (Signed)
pls refer to dermatology, [facility] will probably be quickest

## 2019-02-11 NOTE — Telephone Encounter (Signed)
Patient has been referred to [facility].

## 2019-02-11 NOTE — Addendum Note (Signed)
Addended by: Edgar Frisk on: 02/11/2019 11:11 AM   Modules accepted: Orders

## 2019-02-16 ENCOUNTER — Telehealth: Payer: Self-pay | Admitting: Medical

## 2019-02-16 NOTE — Telephone Encounter (Signed)
Ok, let her know.

## 2019-02-16 NOTE — Telephone Encounter (Signed)
Referral was sent to [facility]. Will send to [facility].

## 2019-02-16 NOTE — Telephone Encounter (Signed)
Horris Latino emailed me.  Please check on status of derm referral.  She prefers [facility] if we haven't already set this up.   Please let her know.

## 2019-02-17 NOTE — Telephone Encounter (Signed)
lmom informing patient that referral has been placed to [facility].

## 2019-02-20 MED FILL — KETOCONAZOLE 2% CREAM: 2 | 30 days supply | Qty: 30 | Fill #0

## 2019-03-13 ENCOUNTER — Other Ambulatory Visit: Payer: Self-pay

## 2019-03-17 ENCOUNTER — Ambulatory Visit: Payer: No Typology Code available for payment source | Admitting: Certified Nurse Midwife

## 2019-03-24 ENCOUNTER — Other Ambulatory Visit: Payer: Self-pay

## 2019-03-24 ENCOUNTER — Ambulatory Visit (INDEPENDENT_AMBULATORY_CARE_PROVIDER_SITE_OTHER): Payer: No Typology Code available for payment source | Admitting: Certified Nurse Midwife

## 2019-03-24 ENCOUNTER — Encounter: Payer: Self-pay | Admitting: Certified Nurse Midwife

## 2019-03-24 VITALS — BP 114/64 | HR 70 | Temp 97.0°F | Resp 16 | Ht 66.25 in | Wt 161.0 lb

## 2019-03-24 DIAGNOSIS — Z01419 Encounter for gynecological examination (general) (routine) without abnormal findings: Secondary | ICD-10-CM

## 2019-03-24 NOTE — Progress Notes (Signed)
38 y.o. G73P1001 Divorced  Caucasian Fe here for annual exam. Contraception Paragard working well, spotting has become less. Spouse considering vasectomy. Some weight gain that she is aware of had worked on weight loss previously. Sees PCP yearly with labs. Has noted an area in vagina that is sensitive off and on. Feels it has to do with previous repair after birth of daughter. No bleeding from area or pain. No other health issues today.  No LMP recorded. (Menstrual status: IUD).          Sexually active: Yes.    The current method of family planning is IUD.    Exercising: Yes.    bike & tennis Smoker:  no  Review of Systems  Constitutional: Negative.   HENT: Negative.   Eyes: Negative.   Respiratory: Negative.   Cardiovascular: Negative.   Gastrointestinal: Negative.   Genitourinary: Negative.   Musculoskeletal: Negative.   Skin: Negative.   Neurological: Negative.   Endo/Heme/Allergies: Negative.   Psychiatric/Behavioral: Negative.     Health Maintenance: Pap:  02-14-17 neg HPV HR neg, 03-13-18 neg History of Abnormal Pap: yes MMG:  none Self Breast exams: yes Colonoscopy:  none BMD:   none TDaP:  Does with work Shingles: no Pneumonia: no Hep C and HIV: HIV neg with pregnancy Labs:no    reports that she has never smoked. She has never used smokeless tobacco. She reports current alcohol use. She reports that she does not use drugs.  Past Medical History:  Diagnosis Date  . Allergy   . Bilateral bunions   . 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 Carpal tunnel syndrome   . Dysplasia of cervix, low grade (CIN 1)    03/2016  . Eczema   . GERD (gastroesophageal reflux disease)   . History of abnormal cervical Pap smear 2009, 2010, 2013  . Somatic dysfunction of spine, thoracic   . Thoracic back pain   . Urinary tract infection    several prior  . Wears contact lenses     Past Surgical History:  Procedure Laterality Date  . COLPOSCOPY    . INTRAUTERINE DEVICE INSERTION   12/20/10   Paraguard    Current Outpatient Medications  Medication Sig Dispense Refill  . Ascorbic Acid (VITAMIN C PO) Take by mouth.    Marland Kitchen CALCIUM PO Take by mouth.    . Cholecalciferol (VITAMIN D3 PO) Take by mouth.    . IRON PO Take by mouth.    . pantoprazole (PROTONIX) 40 MG tablet TAKE 1 TABLET (40 MG TOTAL) BY MOUTH DAILY. 90 tablet 3  . PARAGARD INTRAUTERINE COPPER IU by Intrauterine route.     No current facility-administered medications for this visit.     Family History  Problem Relation Age of Onset  . Depression Mother   . Diabetes Mother   . Thyroid disease Mother   . Glaucoma Mother   . Peripheral vascular disease Mother   . Diabetes Father   . Diabetes Maternal Grandmother   . Stroke Maternal Grandmother   . Heart disease Maternal Grandmother        CABG  . Cancer Maternal Grandmother        breast  . Breast cancer Paternal Grandmother 55          . COPD Paternal Grandmother   . Cancer Cousin 42       breast, estrogen+, BRCA+  . Cancer Cousin        breast    ROS:  Pertinent items are noted in HPI.  Otherwise, a comprehensive ROS was negative.  Exam:   BP 114/64   Pulse 70   Temp (!) 97 F (36.1 C) (Skin)   Resp 16   Ht 5' 6.25" (1.683 m)   Wt 161 lb (73 kg)   BMI 25.79 kg/m  Height: 5' 6.25" (168.3 cm) Ht Readings from Last 3 Encounters:  03/24/19 5' 6.25" (1.683 m)  01/22/19 5' 7"  (1.702 m)  08/15/18 5' 7"  (1.702 m)    General appearance: alert, cooperative and appears stated age Head: Normocephalic, without obvious abnormality, atraumatic Neck: no adenopathy, supple, symmetrical, trachea midline and thyroid normal to inspection and palpation Lungs: clear to auscultation bilaterally Breasts: normal appearance, no masses or tenderness, No nipple retraction or dimpling, No nipple discharge or bleeding, No axillary or supraclavicular adenopathy Heart: regular rate and rhythm Abdomen: soft, non-tender; no masses,  no  organomegaly Extremities: extremities normal, atraumatic, no cyanosis or edema Skin: Skin color, texture, turgor normal. No rashes or lesions Lymph nodes: Cervical, supraclavicular, and axillary nodes normal. No abnormal inguinal nodes palpated Neurologic: Grossly normal   Pelvic: External genitalia:  no lesions              Urethra:  normal appearing urethra with no masses, tenderness or lesions              Bartholin's and Skene's: normal                 Vagina: normal appearing vagina with normal color and discharge, no lesions, at introitus on left scar tissue noted from previous laceration repair. Non tender, fibrous feel. Shown to patient in mirror.              Cervix: no cervical motion tenderness, no lesions and normal appearance              Pap taken: No. Bimanual Exam:  Uterus:  normal size, contour, position, consistency, mobility, non-tender and anteverted              Adnexa: normal adnexa and no mass, fullness, tenderness               Rectovaginal: Confirms               Anus:  normal sphincter tone, no lesions  Chaperone present: yes  A:  Well Woman with normal exam  Contraception Paragard IUD due for removal in 2022  Vaginal scar tissue from previous laceration repair noted  Continue follow up with PCP as indicated.  P:   Reviewed health and wellness pertinent to exam  Discussed expectations with Paragard IUD and menstrual cycle.  Discussed scar tissue finding and can massage area with coconut oil or vitamin E oil, which decrease sensitivity.  Pap smear: no   counseled on breast self exam, mammography screening, feminine hygiene, adequate intake of calcium and vitamin D, diet and exercise  return annually or prn  An After Visit Summary was printed and given to the patient.

## 2019-03-24 NOTE — Patient Instructions (Signed)

## 2019-04-01 MED FILL — PANTOPRAZOLE SOD DR 40 MG T: 40 | 90 days supply | Qty: 90 | Fill #1

## 2019-05-20 ENCOUNTER — Other Ambulatory Visit: Payer: Self-pay | Admitting: Medical

## 2019-05-20 MED ORDER — DICLOFENAC SODIUM 75 MG PO TBEC: 75.0000 mg | DELAYED_RELEASE_TABLET | Freq: Two times a day (BID) | ORAL | 0 refills | Status: DC

## 2019-05-20 MED FILL — DICLOFENAC SODIUM 75 MG TAB: 75 | 15 days supply | Qty: 30 | Fill #0

## 2019-06-18 ENCOUNTER — Other Ambulatory Visit: Payer: Self-pay | Admitting: Neurological Surgery

## 2019-06-18 DIAGNOSIS — M5416 Radiculopathy, lumbar region: Secondary | ICD-10-CM

## 2019-06-19 MED FILL — MELOXICAM 7.5 MG TABLET: 7.5 | 15 days supply | Qty: 30 | Fill #0

## 2019-06-23 ENCOUNTER — Other Ambulatory Visit: Payer: Self-pay | Admitting: Medical

## 2019-06-23 MED FILL — PANTOPRAZOLE SOD DR 40 MG T: 40 | 90 days supply | Qty: 90 | Fill #0

## 2019-08-03 ENCOUNTER — Other Ambulatory Visit: Payer: Self-pay

## 2019-08-03 ENCOUNTER — Ambulatory Visit
Admission: RE | Admit: 2019-08-03 | Discharge: 2019-08-03 | Disposition: A | Discharge status: Home / Self Care | Payer: No Typology Code available for payment source | Source: Ambulatory Visit | Attending: Neurological Surgery | Admitting: Neurological Surgery

## 2019-08-03 DIAGNOSIS — M5416 Radiculopathy, lumbar region: Secondary | ICD-10-CM

## 2019-08-03 MED ORDER — IOPAMIDOL (ISOVUE-M 200) INJECTION 41%
1.0000 mL | Freq: Once | INTRAMUSCULAR | Status: AC
  Administered 2019-08-03: 1 mL via EPIDURAL

## 2019-08-03 MED ORDER — METHYLPREDNISOLONE ACETATE 40 MG/ML INJ SUSP (RADIOLOG
120.0000 mg | Freq: Once | INTRAMUSCULAR | Status: AC
  Administered 2019-08-03: 120 mg via EPIDURAL

## 2019-08-03 NOTE — Discharge Instructions (Signed)

## 2019-08-14 ENCOUNTER — Encounter: Payer: Self-pay | Admitting: Certified Nurse Midwife

## 2019-10-01 ENCOUNTER — Other Ambulatory Visit: Payer: Self-pay | Admitting: Medical

## 2019-10-01 MED FILL — PANTOPRAZOLE SOD DR 40 MG T: 40 | 90 days supply | Qty: 90 | Fill #0

## 2019-10-14 ENCOUNTER — Encounter: Payer: Self-pay | Admitting: Medical

## 2019-10-14 ENCOUNTER — Ambulatory Visit (INDEPENDENT_AMBULATORY_CARE_PROVIDER_SITE_OTHER): Payer: No Typology Code available for payment source | Admitting: Medical

## 2019-10-14 ENCOUNTER — Other Ambulatory Visit: Payer: Self-pay

## 2019-10-14 VITALS — BP 100/60 | HR 65 | Ht 66.0 in | Wt 159.4 lb

## 2019-10-14 DIAGNOSIS — G8929 Other chronic pain: Secondary | ICD-10-CM

## 2019-10-14 DIAGNOSIS — Z0000 Encounter for general adult medical examination without abnormal findings: Secondary | ICD-10-CM | POA: Diagnosis not present

## 2019-10-14 DIAGNOSIS — Z1231 Encounter for screening mammogram for malignant neoplasm of breast: Secondary | ICD-10-CM

## 2019-10-14 DIAGNOSIS — R748 Abnormal levels of other serum enzymes: Secondary | ICD-10-CM

## 2019-10-14 DIAGNOSIS — K219 Gastro-esophageal reflux disease without esophagitis: Secondary | ICD-10-CM | POA: Diagnosis not present

## 2019-10-14 DIAGNOSIS — M549 Dorsalgia, unspecified: Secondary | ICD-10-CM

## 2019-10-14 DIAGNOSIS — E559 Vitamin D deficiency, unspecified: Secondary | ICD-10-CM

## 2019-10-14 DIAGNOSIS — Z803 Family history of malignant neoplasm of breast: Secondary | ICD-10-CM

## 2019-10-14 DIAGNOSIS — J301 Allergic rhinitis due to pollen: Secondary | ICD-10-CM | POA: Diagnosis not present

## 2019-10-14 DIAGNOSIS — R109 Unspecified abdominal pain: Secondary | ICD-10-CM

## 2019-10-14 NOTE — Patient Instructions (Signed)
Please call to schedule your 1st screening mammogram   [facility]  [phone] [address] [place], [place] [address]   Preventative Care for Adults - Female   Thank you for coming in for your well visit today, and thank you for trusting [place] with your care!   Maintain regular health and wellness exams:  A routine yearly physical is a good way to check in with your primary care provider about your health and preventive screening. It is also an opportunity to share updates about your health and any concerns you have, and receive a thorough all-over exam.   Most health insurance companies pay for at least some preventative services.  Check with your health plan for specific coverages.  What preventative services do women need?  Adult women should have their weight and blood pressure checked regularly.   Women age 11 and older should have their cholesterol levels checked regularly.  Women should be screened for cervical cancer with a Pap smear and pelvic exam beginning at either age 9, or 3 years after they become sexually activity.    Breast cancer screening generally begins at age 36 with a mammogram and breast exam by your primary care provider.    Beginning at age 82 and continuing to age 65, women should be screened for colorectal cancer.  Certain people may need continued testing until age 40.  Updating vaccinations is part of preventative care.  Vaccinations help protect against diseases such as the flu.  Osteoporosis is a disease in which the bones lose minerals and strength as we age. Women ages 46 and over should discuss this with their caregivers, as should women after menopause who have other risk factors.  Lab tests are generally done as part of preventative care to screen for anemia and blood disorders, to screen for problems with the kidneys and liver, to screen for bladder problems, to check blood sugar, and to check your  cholesterol level.  Preventative services generally include counseling about diet, exercise, avoiding tobacco, drugs, excessive alcohol consumption, and sexually transmitted infections.    Xrays and CT scans are not normally done as a preventative test, and most insurances do not pay for imaging for screening other than as discussed under cancer screens below.   On the other hand, if you have certain medical concerns, imaging may be necessary as a diagnostic test.   Your Medical Team Your medical team starts with [place], your PCP or primary care provider.  Please use our services for your routine care such as physicals, screenings, immunizations, sick visits, and your first stop for general medical concerns.  You can call our number for after hours information for urgent questions that may need attention but cannot wait til the next business day.    Urgent care-urgent cares exist to provide care when your primary care office would typically be closed such as evenings or weekends.   Urgent care is for evaluation of urgent medical problems that do not necessarily require emergency department care, but cannot wait til the next business day when we are open.  Emergency department care-please reserve emergency department care for serious, urgent, possibly life-threatening medical problems.  This includes issues like possible stroke, heart attack, significant injury, mental health crisis, or other urgent need that requires immediate medical attention.     See your dentist office twice yearly for hygiene and cleaning visits.   Brush your teeth and floss your teeth daily.  See your  eye doctor yearly for routine eye exam and screenings for glaucoma and retinal disease.  See your gynecologist yearly if you do not have your female/gynecological exams at our office.     Specific Concerns: We will refer to gastroenterology    Vaccines:  Stay up to date with your tetanus shots and other required  immunizations. You should have a booster for tetanus every 10 years. Be sure to get your flu shot every year, since 5%-20% of the [place] population comes down with the flu. The flu vaccine changes each year, so being vaccinated once is not enough. Get your shot in the fall, before the flu season peaks.   Other vaccines to consider:  Pneumococcal vaccine to protect against certain types of pneumonia.  This is normally recommended for adults age 40 or older.  However, adults younger than 40 years old with certain underlying conditions such as diabetes, heart or lung disease should also receive the vaccine.  Shingles vaccine to protect against Varicella Zoster if you are older than age 40, or younger than 40 years old with certain underlying illness.  If you have not had the Shingrix vaccine, please call your insurer to inquire about coverage for the Shingrix vaccine given in 2 doses.   Some insurers cover this vaccine after age 40, some cover this after age 40.  If your insurer covers this, then call to schedule appointment to have this vaccine here  Hepatitis A vaccine to protect against a form of infection of the liver by a virus acquired from food.  Hepatitis B vaccine to protect against a form of infection of the liver by a virus acquired from blood or body fluids, particularly if you work in health care.  If you plan to travel internationally, check with your local health department for specific vaccination recommendations.  Human Papilloma Virus or HPV causes cancer of the cervix, and other infections that can be transmitted from person to person. There is a vaccine for HPV, and males should get immunized between the ages of 5011 and 7626. It requires a series of 3 shots.   Covid/Coronavirus - as the vaccines are becoming available, please consider vaccination if you are a health care worker, first responder, or have significant health problems such as asthma, COPD, heart disease, hypertension,  diabetes, obesity, multiple medical problems, over age 40yo, or immunocompromised.      What should I know about Cancer screening? Many types of cancers can be detected early and may often be prevented. Lung Cancer  You should be screened every year for lung cancer if: ? You are a current smoker who has smoked for at least 30 years. ? You are a former smoker who has quit within the past 15 years.  Talk to your health care provider about your screening options, when you should start screening, and how often you should be screened.  Breast cancer screening is essential to preventive care for women. All women age 720 and older should perform a breast self-exam every month. At age 240 and older, women should have their caregiver complete a breast exam each year. Women at ages 2940 and older should have a mammogram (x-ray film) of the breasts. Your caregiver can discuss how often you need mammograms.    Breast cancer screening   [facility]   Or    [facility]   [phone]         [phone] [address]  [address] [place], [place] [address]        [place], [place] [address]  Cervical cancer screening includes taking a Pap smear (sample of cells examined under a microscope) from the cervix (end of the uterus). It also includes testing for HPV (Human Papilloma Virus, which can cause cervical cancer). Screening and a pelvic exam should begin at age 23, or 3 years after a woman becomes sexually active. Screening should occur every year, with a Pap smear but no HPV testing, up to age 39. After age 50, you should have a Pap smear every 3 years with HPV testing, if no HPV was found previously.   Colorectal Cancer  Routine colorectal cancer screening usually begins at 40 years of age and should be repeated every 5-10 years until you are 40 years old. You may need to be screened more often if early forms of precancerous polyps or small  growths are found. Your health care provider may recommend screening at an earlier age if you have risk factors for colon cancer.  Your health care provider may recommend using home test kits to check for hidden blood in the stool.  A small camera at the end of a tube can be used to examine your colon (sigmoidoscopy or colonoscopy). This checks for the earliest forms of colorectal cancer.  Skin Cancer  Check your skin from head to toe regularly.  Tell your health care provider about any new moles or changes in moles, especially if: ? There is a change in a mole's size, shape, or color. ? You have a mole that is larger than a pencil eraser.  Always use sunscreen. Apply sunscreen liberally and repeat throughout the day.  Protect yourself by wearing long sleeves, pants, a wide-brimmed hat, and sunglasses when outside.    GENERAL RECOMMENDATIONS FOR GOOD HEALTH:  Healthy diet:  Eat a variety of foods, including fruit, vegetables, animal or vegetable protein, such as meat, fish, chicken, and eggs, or beans, lentils, tofu, and grains, such as rice.  Drink plenty of water daily.  Decrease saturated fat in the diet, avoid lots of red meat, processed foods, sweets, fast foods, and fried foods.  Exercise:  Aerobic exercise helps maintain good heart health. At least 30-40 minutes of moderate-intensity exercise is recommended. For example, a brisk walk that increases your heart rate and breathing. This should be done on most days of the week.   Find a type of exercise or a variety of exercises that you enjoy so that it becomes a part of your daily life.  Examples are running, walking, swimming, water aerobics, and biking.  For motivation and support, explore group exercise such as aerobic class, spin class, Zumba, Yoga,or  martial arts, etc.    Set exercise goals for yourself, such as a certain weight goal, walk or run in a race such as a 5k walk/run.  Speak to your primary care provider about  exercise goals.  Your weight readings per our records: Wt Readings from Last 3 Encounters:  10/14/19 159 lb 6.4 oz (72.3 kg)  03/24/19 161 lb (73 kg)  01/22/19 160 lb 6.4 oz (72.8 kg)    Body mass index is 25.73 kg/m.    Disease prevention:  If you smoke or chew tobacco, find out from your caregiver how to quit. It can literally save your life, no matter how long you have been a tobacco user. If you do not use tobacco, never begin.   Maintain a healthy diet and  normal weight. Increased weight leads to problems with blood pressure and diabetes.   The Body Mass Index or BMI is a way of measuring how much of your body is fat. Having a BMI above 27 increases the risk of heart disease, diabetes, hypertension, stroke and other problems related to obesity. Your caregiver can help determine your BMI and based on it develop an exercise and dietary program to help you achieve or maintain this important measurement at a healthful level.  High blood pressure causes heart and blood vessel problems.  Persistent high blood pressure should be treated with medicine if weight loss and exercise do not work.  Your blood pressure readings per our records:     BP Readings from Last 3 Encounters:  10/14/19 100/60  08/03/19 (!) 133/50  03/24/19 114/64     Fat and cholesterol leaves deposits in your arteries that can block them. This causes heart disease and vessel disease elsewhere in your body.  If your cholesterol is found to be high, or if you have heart disease or certain other medical conditions, then you may need to have your cholesterol monitored frequently and be treated with medication.   Ask if you should have a cardiac stress test if your history suggests this. A stress test is a test done on a treadmill that looks for heart disease. This test can find disease prior to there being a problem.    Menopause can be associated with physical symptoms and risks. Hormone replacement therapy is  available to decrease these. You should talk to your caregiver about whether starting or continuing to take hormones is right for you.   Osteoporosis is a disease in which the bones lose minerals and strength as we age. This can result in serious bone fractures. Risk of osteoporosis can be identified using a bone density scan. Women ages 47 and over should discuss this with their caregivers, as should women after menopause who have other risk factors. Ask your caregiver whether you should be taking a calcium supplement and Vitamin D, to reduce the rate of osteoporosis.   Osteoporosis screening/bone density testing:  [facility]   Or    [facility]   [phone]          [phone] [address]       [address] [place], [place] [address]        [place], [place] [address]    Avoid drinking alcohol in excess (more than two drinks per day).  Avoid use of street drugs. Do not share needles with anyone. Ask for professional help if you need assistance or instructions on stopping the use of alcohol, cigarettes, and/or drugs.  Brush your teeth twice a day with fluoride toothpaste, and floss once a day. Good oral hygiene prevents tooth decay and gum disease. The problems can be painful, unattractive, and can cause other health problems. Visit your dentist for a routine oral and dental check up and preventive care every 6-12 months.   Safety:  Use seatbelts 100% of the time, whether driving or as a passenger.  Use safety devices such as hearing protection if you work in environments with loud noise or significant background noise.  Use safety glasses when doing any work that could send debris in to the eyes.  Use a helmet if you ride a bike or motorcycle.  Use appropriate safety gear for contact sports.  Talk to your caregiver about gun safety.  Use  sunscreen with a SPF (or skin protection factor) of 15 or greater.  Lighter skinned people  are at a greater risk of skin cancer. Don't forget to also wear sunglasses in order to protect your eyes from too much damaging sunlight. Damaging sunlight can accelerate cataract formation.   Keep carbon monoxide and smoke detectors in your home functioning at all times. Change the batteries every 6 months or use a model that plugs into the wall.    Sexual activity: . Sex is a normal part of life and sexual activity can continue into older adulthood for many healthy people.   . If you are having issues related to sexual activity, please follow up to discuss this further.   . If you are not in a monogamous relationship or have more than one partner, please practice safe sex.  Use condoms. Condoms are used for birth control and to help reduce the spread of sexually transmitted infections (or STIs).  Some of the STIs are gonorrhea (the clap), chlamydia, syphilis, trichomonas, herpes, HPV (human papilloma virus) and HIV (human immunodeficiency virus) which causes AIDS. The herpes, HIV and HPV are viral illnesses that have no cure. These can result in disability, cancer and death.   We are able to test for STIs here at our office.

## 2019-10-14 NOTE — Progress Notes (Signed)
Subjective: Chief Complaint  Patient presents with  . Annual Exam    not fasting    Here for physical  Medical team: Melvia Heaps  CNM gynecology Dentist Eye doctor Massage therapy  acupuncturist Dr. Kristeen Miss, neurosurgery   Concerns: Has had ongoing back problems this year.   Saw Dr. Ellene Route, has had EDSI.  Has done some physical therapy.   Started going to massage therapy alternating with acupuncture every other week.  Started doing piliates, hurt upper back 2 weeks ago.   Been trying to manage the back pain.  Has had more problems with GERD of late.  Has tried different foods to try and avoid triggers.  Bread and cheesits seems to flare things up.   Wants to see Dr. Gerrit Heck for further eval of ongoing abdominal issues, GERD, reliance on Protonix for years.  Taking some vit D and calcium  Past Medical History:  Diagnosis Date  . Allergy   . Bilateral bunions   . 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 Carpal tunnel syndrome   . Dysplasia of cervix, low grade (CIN 1)    03/2016  . Eczema   . GERD (gastroesophageal reflux disease)   . Somatic dysfunction of spine, thoracic   . Thoracic back pain   . Urinary tract infection    several prior  . Wears contact lenses     Past Surgical History:  Procedure Laterality Date  . COLPOSCOPY    . INTRAUTERINE DEVICE INSERTION  12/20/10   Paraguard    Social History   Socioeconomic History  . Marital status: Divorced    Spouse name: Not on file  . Number of children: 1  . Years of education: Not on file  . Highest education level: Not on file  Occupational History  . Occupation: Electrical engineer    Employer: [employer]  Tobacco Use  . Smoking status: Never Smoker  . Smokeless tobacco: Never Used  Substance and Sexual Activity  . Alcohol use: Yes    Comment: less than 3  . Drug use: No  . Sexual activity: Yes    Partners: Male    Birth control/protection: I.U.D.  Other Topics Concern  . Not on file   Social History Narrative   Lives with her husband and her daughter Janeann Forehand, 1 dog.  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  Runs, likes to read.  Agnostic.  09/2019   Social Determinants of Health   Financial Resource Strain:   . Difficulty of Paying Living Expenses:   Food Insecurity:   . Worried About Charity fundraiser in the Last Year:   . Arboriculturist in the Last Year:   Transportation Needs:   . Film/video editor (Medical):   Marland Kitchen Lack of Transportation (Non-Medical):   Physical Activity:   . Days of Exercise per Week:   . Minutes of Exercise per Session:   Stress:   . Feeling of Stress :   Social Connections:   . Frequency of Communication with Friends and Family:   . Frequency of Social Gatherings with Friends and Family:   . Attends Religious Services:   . Active Member of Clubs or Organizations:   . Attends Archivist Meetings:   Marland Kitchen Marital Status:   Intimate Partner Violence:   . Fear of Current or Ex-Partner:   . Emotionally Abused:   Marland Kitchen Physically Abused:   . Sexually Abused:     Family History  Problem Relation Age of Onset  . Depression Mother   . Diabetes Mother   . Thyroid disease Mother   . Glaucoma Mother   . Peripheral vascular disease Mother   . Diabetes Father   . Diabetes Maternal Grandmother   . Stroke Maternal Grandmother   . Heart disease Maternal Grandmother        CABG  . Cancer Maternal Grandmother        breast  . Breast cancer Paternal Grandmother 25          . COPD Paternal Grandmother   . Cancer Cousin 42       breast, estrogen+, BRCA+  . Cancer Cousin        breast     Current Outpatient Medications:  .  Ascorbic Acid (VITAMIN C PO), Take by mouth., Disp: , Rfl:  .  Cholecalciferol (VITAMIN D3 PO), Take by mouth., Disp: , Rfl:  .  IRON PO, Take by mouth., Disp: , Rfl:  .  pantoprazole (PROTONIX) 40 MG tablet, TAKE 1 TABLET BY MOUTH DAILY., Disp: 90 tablet, Rfl: 0 .  PARAGARD INTRAUTERINE COPPER IU, by  Intrauterine route., Disp: , Rfl:   Allergies  Allergen Reactions  . Augmentin [Amoxicillin-Pot Clavulanate]     Upset stomach  . Sulfur Swelling    Of tongue.     Reviewed their medical, surgical, family, social, medication, and allergy history and updated chart as appropriate.   Review of Systems Constitutional: -fever, -chills, -sweats, -unexpected weight change, -decreased appetite, -fatigue Allergy: -sneezing, -itching, -congestion Dermatology: -changing moles, --rash, -lumps ENT: -runny nose, -ear pain, -sore throat, -hoarseness, -sinus pain, -teeth pain, - ringing in ears, -hearing loss, -nosebleeds Cardiology: -chest pain, -palpitations, -swelling, -difficulty breathing when lying flat, -waking up short of breath Respiratory: -cough, -shortness of breath, -difficulty breathing with exercise or exertion, -wheezing, -coughing up blood Gastroenterology: +abdominal pain, -nausea, -vomiting, -diarrhea, -constipation, -blood in stool, -changes in bowel movement, -difficulty swallowing or eating Hematology: -bleeding, -bruising  Musculoskeletal: -joint aches, -muscle aches, -joint swelling, +back pain, -neck pain, -cramping, -changes in gait Ophthalmology: denies vision changes, eye redness, itching, discharge Urology: -burning with urination, -difficulty urinating, -blood in urine, -urinary frequency, -urgency, -incontinence Neurology: -headache, -weakness, -tingling, -numbness, -memory loss, -falls, -dizziness Psychology: -depressed mood, -agitation, -sleep problems Breast/gyn: -breast tendnerss, -discharge, -lumps, -vaginal discharge,- irregular periods, -heavy periods     Objective:  BP 100/60   Pulse 65   Ht 5' 6"  (1.676 m)   Wt 159 lb 6.4 oz (72.3 kg)   SpO2 98%   BMI 25.73 kg/m   General appearance: alert, no distress, WD/WN, Caucasian female Skin: scattered few macules, no worrisome lesions Neck: supple, no lymphadenopathy, no thyromegaly, no masses, normal ROM, no  bruits Chest: non tender, normal shape and expansion Heart: RRR, normal S1, S2, no murmurs Lungs: CTA bilaterally, no wheezes, rhonchi, or rales Abdomen: +bs, soft, non tender, non distended, no masses, no hepatomegaly, no splenomegaly, no bruits Back: non tender, normal ROM, no scoliosis Musculoskeletal: upper extremities non tender, no obvious deformity, normal ROM throughout, lower extremities non tender, no obvious deformity, normal ROM throughout Extremities: no edema, no cyanosis, no clubbing Pulses: 2+ symmetric, upper and lower extremities, normal cap refill Neurological: alert, oriented x 3, CN2-12 intact, strength normal upper extremities and lower extremities, sensation normal throughout, DTRs 2+ throughout, no cerebellar signs, gait normal Psychiatric: normal affect, behavior normal, pleasant  Breast/gyn/rectal - deferred to gynecology   Assessment and Plan :   Encounter Diagnoses  Name Primary?  . Encounter for health maintenance examination in adult Yes  . Gastroesophageal reflux disease without esophagitis   . Allergic rhinitis due to pollen, unspecified seasonality   . Alkaline phosphatase elevation   . Vitamin D deficiency   . Family history of breast cancer   . Encounter for screening mammogram for malignant neoplasm of breast   . Abdominal pain, unspecified abdominal location   . Chronic back pain, unspecified back location, unspecified back pain laterality     Physical exam - discussed and counseled on healthy lifestyle, diet, exercise, preventative care, vaccinations, sick and well care, proper use of emergency dept and after hours care, and addressed their concerns.    Health screening: Advised they see their eye doctor yearly for routine vision care. Advised they see their dentist yearly for routine dental care including hygiene visits twice yearly.  Cancer screening Counseled on self breast exams, mammograms, cervical cancer screening  Referral for first  screening mammogram  F/u with gyn for exam, pap   Colonoscopy:  Referred to GI for endoscopy , chronic GERD and abdominal pains   Vaccinations: Advised yearly influenza vaccine She is up to date on Covid and Tetanus vaccines    Separate significant chronic issues discussed: Referral to GI for chronic abdominal pain, GERD  F/u with Dr. Ellene Route, neurosurgery prn, c/t massage and acupuncture therapy   Zerah was seen today for annual exam.  Diagnoses and all orders for this visit:  Encounter for health maintenance examination in adult -     Comprehensive metabolic panel -     CBC with Differential/Platelet -     VITAMIN D 25 Hydroxy (Vit-D Deficiency, Fractures) -     MM DIGITAL SCREENING BILATERAL; Future -     Ambulatory referral to Gastroenterology  Gastroesophageal reflux disease without esophagitis -     Ambulatory referral to Gastroenterology  Allergic rhinitis due to pollen, unspecified seasonality  Alkaline phosphatase elevation -     Comprehensive metabolic panel  Vitamin D deficiency -     VITAMIN D 25 Hydroxy (Vit-D Deficiency, Fractures)  Family history of breast cancer  Encounter for screening mammogram for malignant neoplasm of breast -     MM DIGITAL SCREENING BILATERAL; Future  Abdominal pain, unspecified abdominal location -     Ambulatory referral to Gastroenterology  Chronic back pain, unspecified back location, unspecified back pain laterality    Follow-up pending labs, yearly for physical

## 2019-10-15 ENCOUNTER — Telehealth: Payer: Self-pay | Admitting: Internal Medicine

## 2019-10-15 LAB — COMPREHENSIVE METABOLIC PANEL
ALT: 12 IU/L (ref 0–32)
AST: 22 IU/L (ref 0–40)
Albumin/Globulin Ratio: 2 (ref 1.2–2.2)
Albumin: 4.5 g/dL (ref 3.8–4.8)
Alkaline Phosphatase: 197 IU/L — ABNORMAL HIGH (ref 48–121)
BUN/Creatinine Ratio: 17 (ref 9–23)
BUN: 14 mg/dL (ref 6–24)
Bilirubin Total: 0.2 mg/dL (ref 0.0–1.2)
CO2: 23 mmol/L (ref 20–29)
Calcium: 9.5 mg/dL (ref 8.7–10.2)
Chloride: 104 mmol/L (ref 96–106)
Creatinine, Ser: 0.81 mg/dL (ref 0.57–1.00)
GFR calc Af Amer: 105 mL/min/{1.73_m2} (ref >59)
GFR calc non Af Amer: 91 mL/min/{1.73_m2} (ref >59)
Globulin, Total: 2.2 g/dL (ref 1.5–4.5)
Glucose: 89 mg/dL (ref 65–99)
Potassium: 4.5 mmol/L (ref 3.5–5.2)
Sodium: 139 mmol/L (ref 134–144)
Total Protein: 6.7 g/dL (ref 6.0–8.5)

## 2019-10-15 LAB — CBC WITH DIFFERENTIAL/PLATELET
Basophils Absolute: 0.1 10*3/uL (ref 0.0–0.2)
Basos: 1 %
EOS (ABSOLUTE): 0.1 10*3/uL (ref 0.0–0.4)
Eos: 1 %
Hematocrit: 39.7 % (ref 34.0–46.6)
Hemoglobin: 12.6 g/dL (ref 11.1–15.9)
Immature Grans (Abs): 0 10*3/uL (ref 0.0–0.1)
Immature Granulocytes: 0 %
Lymphocytes Absolute: 2 10*3/uL (ref 0.7–3.1)
Lymphs: 33 %
MCH: 26.4 pg — ABNORMAL LOW (ref 26.6–33.0)
MCHC: 31.7 g/dL (ref 31.5–35.7)
MCV: 83 fL (ref 79–97)
Monocytes Absolute: 0.3 10*3/uL (ref 0.1–0.9)
Monocytes: 5 %
Neutrophils Absolute: 3.5 10*3/uL (ref 1.4–7.0)
Neutrophils: 60 %
Platelets: 296 10*3/uL (ref 150–450)
RBC: 4.77 x10E6/uL (ref 3.77–5.28)
RDW: 11.9 % (ref 11.7–15.4)
WBC: 5.9 10*3/uL (ref 3.4–10.8)

## 2019-10-15 LAB — VITAMIN D 25 HYDROXY (VIT D DEFICIENCY, FRACTURES): Vit D, 25-Hydroxy: 47.9 ng/mL (ref 30.0–100.0)

## 2019-10-15 NOTE — Telephone Encounter (Signed)
Sure thing, happy to see her. Thanks.

## 2019-10-15 NOTE — Telephone Encounter (Signed)
Hey Dr. Leone Payor- this patient is requesting to switch her GI care to Dr. Barron Alvine. She did not give a reason as to why she wanted to switch just that that was who she wanted to see. Will you okay the switch? Thank you!

## 2019-10-15 NOTE — Telephone Encounter (Signed)
Hey Dr. Barron Alvine- this patient is requesting to transfer her care to you from Dr. Leone Payor. She did not specify a reason just that she would like you as her doctor. Dr. Leone Payor has okayed the switch. Will you accept this patient?

## 2019-10-15 NOTE — Telephone Encounter (Signed)
Left message for patient to callback to schedule with Dr. Cirigliano °

## 2019-10-15 NOTE — Telephone Encounter (Signed)
Sure thing!

## 2019-10-22 LAB — ALKALINE PHOSPHATASE, ISOENZYMES
Alkaline Phosphatase: 198 IU/L — ABNORMAL HIGH (ref 48–121)
BONE FRACTION: 33 % (ref 14–68)
INTESTINAL FRAC.: 48 % — ABNORMAL HIGH (ref 0–18)
LIVER FRACTION: 19 % (ref 18–85)

## 2019-10-22 LAB — SPECIMEN STATUS REPORT

## 2019-11-02 ENCOUNTER — Ambulatory Visit
Admission: RE | Admit: 2019-11-02 | Discharge: 2019-11-02 | Disposition: A | Discharge status: Home / Self Care | Payer: No Typology Code available for payment source | Source: Ambulatory Visit | Attending: Medical | Admitting: Medical

## 2019-11-02 ENCOUNTER — Other Ambulatory Visit: Payer: Self-pay

## 2019-11-02 DIAGNOSIS — Z0000 Encounter for general adult medical examination without abnormal findings: Secondary | ICD-10-CM

## 2019-11-02 DIAGNOSIS — Z1231 Encounter for screening mammogram for malignant neoplasm of breast: Secondary | ICD-10-CM

## 2019-12-08 ENCOUNTER — Ambulatory Visit (INDEPENDENT_AMBULATORY_CARE_PROVIDER_SITE_OTHER): Payer: No Typology Code available for payment source | Admitting: Medical

## 2019-12-08 ENCOUNTER — Encounter: Payer: Self-pay | Admitting: Medical

## 2019-12-08 ENCOUNTER — Other Ambulatory Visit: Payer: Self-pay

## 2019-12-08 ENCOUNTER — Ambulatory Visit (HOSPITAL_COMMUNITY): Payer: Self-pay

## 2019-12-08 VITALS — BP 110/68 | HR 61 | Temp 98.6°F | Ht 66.0 in | Wt 154.6 lb

## 2019-12-08 DIAGNOSIS — R109 Unspecified abdominal pain: Secondary | ICD-10-CM | POA: Diagnosis not present

## 2019-12-08 LAB — POCT URINALYSIS DIP (PROADVANTAGE DEVICE)
Bilirubin, UA: NEGATIVE
Blood, UA: NEGATIVE
Glucose, UA: NEGATIVE mg/dL
Ketones, POC UA: NEGATIVE mg/dL
Leukocytes, UA: NEGATIVE
Nitrite, UA: NEGATIVE
Protein Ur, POC: NEGATIVE mg/dL
Specific Gravity, Urine: 1.015
Urobilinogen, Ur: NEGATIVE
pH, UA: 7.5 (ref 5.0–8.0)

## 2019-12-08 LAB — POCT URINE PREGNANCY: Preg Test, Ur: NEGATIVE

## 2019-12-08 NOTE — Patient Instructions (Signed)
Recommendations  Over the next 48 hours, hydrate well with water and clear fluids  Use BRAT diet, bland easily digestible foods in small portions.    Avoid gassy foods such as onions, fried foods, brussels spouts or fiber bars for the next 48 hours  Try some over the counter Imodium soon, then up to twice daily for a day or 2.  However, if fever over 101, bloody stool, then don't take this  You can use the ibuprofen as needed, but no more than 600mg  twice daily.   Preferably use less than this.  Continue plan to see Dr. in a week  If much worse pain or symptoms in the next 48 hours, we would pursue imaging

## 2019-12-08 NOTE — Progress Notes (Signed)
Subjective:  Lisa Ayers is a 39 y.o. female who presents for Chief Complaint  Patient presents with  . Abdominal Pain    cramping around belly button every 3-5 minutes, chills      Here for abdominal pain, cramping.  She has history of GERD and intermittent abdominal discomfort that we have discussed before.  She actually has her first consult with Dr. Earnest Rosier in about a week.  She was in her usual state of health until yesterday.   She started having pain centered around the bellybutton that is crampy and radiates up and down.  The pain can be intermittent every 3-5 minutes.  Ate some raisin bran for dinner yesterday evening before exercise.   symtpoms began a little later after this.   she played tennis yesterday evening, took some ibuprofen before tennis which is not unusual for her.   Later after tennis pain continued and got worse.  Hurt all night , couldn't sleep.   Was 7/10 pain last night.  This morning she got up, drank some water.  Took some more ibuprofen 4am this morning which has helped in the past.    This helped for a while.  After eating oatmeal this morning pain has returned.  She also had some chills all night long.  No fever.  Pain causes some nausea, but no vomiting.   Normal BMs today and last few days.  Had 1 stool that was loose yesterday earlier in the day before the pain ever occurred.  She attributed that BM to fiber excess in a [place] nutrition bar she ate, but later in the day had normal BM.    LMP few weeks ago, probably mid cycle currently.  She has copper IUD in place on 9th year currently.  No URI or other symptoms  No other aggravating or relieving factors.    No other c/o.  Past Medical History:  Diagnosis Date  . Allergy   . Bilateral bunions   . 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 Carpal tunnel syndrome   . Dysplasia of cervix, low grade (CIN 1)    03/2016  . Eczema   . GERD (gastroesophageal reflux disease)   . Somatic dysfunction of spine,  thoracic   . Thoracic back pain   . Urinary tract infection    several prior  . Wears contact lenses    Past Surgical History:  Procedure Laterality Date  . COLPOSCOPY    . INTRAUTERINE DEVICE INSERTION  12/20/10   Paraguard     The following portions of the patient's history were reviewed and updated as appropriate: allergies, current medications, past family history, past medical history, past social history, past surgical history and problem list.  ROS Otherwise as in subjective above    Objective: BP 110/68   Pulse 61   Temp 98.6 F (37 C)   Ht 5\' 6"  (1.676 m)   Wt 154 lb 9.6 oz (70.1 kg)   SpO2 97%   BMI 24.95 kg/m    Wt Readings from Last 3 Encounters:  12/08/19 154 lb 9.6 oz (70.1 kg)  10/14/19 159 lb 6.4 oz (72.3 kg)  03/24/19 161 lb (73 kg)     General appearance: alert, no distress, well developed, well nourished Abdomen: +bs, soft, she has some generalized tenderness epigastric, central and lower abdomen in general, no rebound, no focal right lower quadrant specific pain no right or left upper quadrant specific tenderness, otherwise non distended, no masses, no  hepatomegaly, no splenomegaly Pulses: 2+ radial pulses, 2+ pedal pulses, normal cap refill Ext: no edema   Assessment: Encounter Diagnosis  Name Primary?  . Abdominal cramping Yes     Plan: We discussed the current symptoms and concerns.  Her exam is somewhat reassuring, less concerned about acute emergent issue.  We discussed differential.  Her exam and symptoms seem less likely gallbladder, appendix, diverticular disease.  No big risk factors for pancreatitis.  I suspect either viral syndrome, or some inflammatory process.    We discussed the following recommendations.  If for some reason of her symptoms are much worse in the next 48 hours we will pursue CT abdomen pelvis  Otherwise she has follow-up with gastroenterology for new established appointment in 1 week  Patient Instructions   Recommendations  Over the next 48 hours, hydrate well with water and clear fluids  Use BRAT diet, bland easily digestible foods in small portions.    Avoid gassy foods such as onions, fried foods, brussels spouts or fiber bars for the next 48 hours  Try some over the counter Imodium soon, then up to twice daily for a day or 2.  However, if fever over 101, bloody stool, then don't take this  You can use the ibuprofen as needed, but no more than 600mg  twice daily.   Preferably use less than this.  Continue plan to see Dr. in a week  If much worse pain or symptoms in the next 48 hours, we would pursue imaging        [name] was seen today for abdominal pain.  Diagnoses and all orders for this visit:  Abdominal cramping -     POCT Urinalysis DIP (Proadvantage Device) -     POCT urine pregnancy    Follow up: prn

## 2019-12-15 ENCOUNTER — Ambulatory Visit (INDEPENDENT_AMBULATORY_CARE_PROVIDER_SITE_OTHER): Payer: No Typology Code available for payment source | Admitting: Gastroenterology

## 2019-12-15 ENCOUNTER — Encounter: Payer: Self-pay | Admitting: Gastroenterology

## 2019-12-15 VITALS — BP 104/68 | HR 64 | Ht 67.0 in | Wt 157.4 lb

## 2019-12-15 DIAGNOSIS — K219 Gastro-esophageal reflux disease without esophagitis: Secondary | ICD-10-CM

## 2019-12-15 MED ORDER — PANTOPRAZOLE SODIUM 20 MG PO TBEC: 20.0000 mg | DELAYED_RELEASE_TABLET | ORAL | 0 refills | Status: DC

## 2019-12-15 MED FILL — PANTOPRAZOLE SOD DR 20 MG T: 20 | 28 days supply | Qty: 20 | Fill #0

## 2019-12-15 NOTE — Progress Notes (Signed)
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  She has a longstanding history of reflux, and has been taking Protonix for the last 15+ years.  Takes Protonix 40 mg Protonix 40 mg QAM with Famotidine QPM prn breakthrough a few times/month.   Index symptoms of heartburn, belching, waterbrash.  No dysphagia.  Symptoms largely well controlled when taking PPI, but tends to have reflux with any missed doses.  Regurgitation with exercise, forward flexion, etc. + Nocturnal symptoms.  Symptoms exacerbated by tea, tomato, baked process foods, along with supine, exercise.  No previous EGD, pH study, esophagram, etc.  She is very interested in alternative treatment options, to include dietary modifications and antireflux surgery if necessary.  GERD-HRQL Questionnaire score: 29/50 (on PPI); check "dissatisfied" with satisfaction with current health condition   Past Medical History:  Diagnosis Date  . Allergy   . Bilateral bunions   . 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 Carpal tunnel syndrome   . Dysplasia of cervix, low grade (CIN 1)    03/2016  . Eczema   . GERD (gastroesophageal reflux disease)   . Somatic dysfunction of spine, thoracic   . Thoracic back pain   . Urinary tract infection    several prior  . Wears contact lenses      Past Surgical History:  Procedure Laterality Date  . COLPOSCOPY    . INTRAUTERINE DEVICE INSERTION  12/20/10   Paraguard   Family History  Problem Relation Age of Onset  . Depression Mother   . Diabetes Mother   . Thyroid disease Mother   . Glaucoma Mother   . Peripheral vascular disease Mother   . Diabetes Father   . Diabetes Maternal Grandmother   . Stroke Maternal Grandmother   . Heart disease Maternal Grandmother        CABG  . Breast cancer Paternal  Grandmother 28          . COPD Paternal Grandmother   . Cancer Cousin 42       breast, estrogen+, BRCA+  . Cancer Cousin        breast  . Colon cancer Neg Hx   . Esophageal cancer Neg Hx    Social History   Tobacco Use  . Smoking status: Never Smoker  . Smokeless tobacco: Never Used  Vaping Use  . Vaping Use: Never used  Substance Use Topics  . Alcohol use: Yes    Comment: less than 3  . Drug use: No   Current Outpatient Medications  Medication Sig Dispense Refill  . pantoprazole (PROTONIX) 40 MG tablet TAKE 1 TABLET BY MOUTH DAILY. 90 tablet 0  . PARAGARD INTRAUTERINE COPPER IU by Intrauterine route.     . Ascorbic Acid (VITAMIN C PO) Take by mouth. (Patient not taking: Reported on 12/08/2019)    . Cholecalciferol (VITAMIN D3 PO) Take by mouth. (Patient not taking: Reported on 12/08/2019)    . IRON PO Take by mouth. (Patient not taking: Reported on 12/08/2019)     No current facility-administered medications for this visit.   Allergies  Allergen Reactions  . Augmentin [Amoxicillin-Pot Clavulanate]     Upset stomach  . Sulfur Swelling    Of tongue.  Review of Systems: All systems reviewed and negative except where noted in HPI.     Physical Exam:    Wt Readings from Last 3 Encounters:  12/15/19 157 lb 6 oz (71.4 kg)  12/08/19 154 lb 9.6 oz (70.1 kg)  10/14/19 159 lb 6.4 oz (72.3 kg)    BP 104/68   Pulse 64   Ht 5' 7"  (1.702 m)   Wt 157 lb 6 oz (71.4 kg)   BMI 24.65 kg/m  Constitutional:  Pleasant, in no acute distress. Psychiatric: Normal mood and affect. Behavior is normal. Neurological: Alert and oriented to person place and time. Skin: Skin is warm and dry. No rashes noted.   ASSESSMENT AND PLAN;   1) GERD 2) Chronic PPI use 23 female with longstanding history of reflux with good response to PPI therapy.  She is interested in alternate treatment options.  Discussed the pathophysiology of reflux at today, and plan for the following:  -EGD to  assess for LES laxity, erosive esophagitis, hiatal hernia -Discussed antireflux dietary modifications -Plan to taper down Protonix if possible: Reduce to 20 mg/day x2 weeks then every other day x2 weeks then off if symptoms otherwise well controlled -Will schedule EGD to be done when Bravo available in [place].  Plan to hold PPI x5 days prior to EGD -Discussed antireflux surgical options, namely TIF.  Will discuss further detail pending response to medication titration endoscopic evaluation -Renal function preserved and normal vitamin D  The indications, risks, and benefits of EGD (with possible Bravo placement) were explained to the patient in detail. Risks include but are not limited to bleeding, perforation, adverse reaction to medications, and cardiopulmonary compromise. Sequelae include but are not limited to the possibility of surgery, hositalization, and mortality. The patient verbalized understanding and wished to proceed. All questions answered, referred to scheduler. Further recommendations pending results of the exam.     Lavena Bullion, DO, FACG  12/15/2019, 4:18 PM   Tysinger, Camelia Eng, PA-C

## 2019-12-15 NOTE — Patient Instructions (Addendum)
If you are age 40 or older, your body mass index should be between 23-30. Your Body mass index is 24.65 kg/m. If this is out of the aforementioned range listed, please consider follow up with your Primary Care Provider.  If you are age 11 or younger, your body mass index should be between 19-25. Your Body mass index is 24.65 kg/m. If this is out of the aformentioned range listed, please consider follow up with your Primary Care Provider.   We have sent the following medications to your pharmacy for you to pick up at your convenience: Protonix 20 mg as directed.  We will contact you regarding EGD with Bravo as soon as available.  Needs to be off PPI five days prior.  It was a pleasure to see you today!  Vito Cirigliano, D.O.

## 2020-02-18 ENCOUNTER — Telehealth: Payer: Self-pay | Admitting: Gastroenterology

## 2020-02-19 ENCOUNTER — Other Ambulatory Visit: Payer: Self-pay | Admitting: Gastroenterology

## 2020-02-19 DIAGNOSIS — K219 Gastro-esophageal reflux disease without esophagitis: Secondary | ICD-10-CM

## 2020-02-19 NOTE — Telephone Encounter (Signed)
LMOM for patient to call back.

## 2020-02-19 NOTE — Telephone Encounter (Signed)
Patient was seen in office on 12/15/19 for GERD symptoms. It was discussed to schedule EGD/BRAVO to be done in the [facility] when available. Patient is scheduled for 03/11/20 at 8:30 am. She knows to hold her PPI 5 days prior. Verbalized instructions over the phone,and sent through MyChart. All questions answered. A consent form has been mailed to patient for her to sign, she will return it the day of procedure. All questions answered. Patient voiced understanding.

## 2020-02-19 NOTE — Telephone Encounter (Signed)
Patient is returning your call.  

## 2020-03-01 ENCOUNTER — Encounter: Payer: Self-pay | Admitting: Gastroenterology

## 2020-03-11 ENCOUNTER — Encounter: Payer: Self-pay | Admitting: Gastroenterology

## 2020-03-11 ENCOUNTER — Ambulatory Visit (AMBULATORY_SURGERY_CENTER): Payer: No Typology Code available for payment source | Admitting: Gastroenterology

## 2020-03-11 ENCOUNTER — Other Ambulatory Visit: Payer: Self-pay

## 2020-03-11 VITALS — BP 110/64 | HR 63 | Temp 97.4°F | Resp 10 | Ht 67.0 in | Wt 157.0 lb

## 2020-03-11 DIAGNOSIS — K319 Disease of stomach and duodenum, unspecified: Secondary | ICD-10-CM

## 2020-03-11 DIAGNOSIS — R1110 Vomiting, unspecified: Secondary | ICD-10-CM

## 2020-03-11 DIAGNOSIS — K2970 Gastritis, unspecified, without bleeding: Secondary | ICD-10-CM

## 2020-03-11 DIAGNOSIS — K219 Gastro-esophageal reflux disease without esophagitis: Secondary | ICD-10-CM

## 2020-03-11 DIAGNOSIS — R12 Heartburn: Secondary | ICD-10-CM

## 2020-03-11 DIAGNOSIS — K449 Diaphragmatic hernia without obstruction or gangrene: Secondary | ICD-10-CM

## 2020-03-11 DIAGNOSIS — K3189 Other diseases of stomach and duodenum: Secondary | ICD-10-CM

## 2020-03-11 MED ORDER — SODIUM CHLORIDE 0.9 % IV SOLN: 500.0000 mL | Freq: Once | INTRAVENOUS | Status: DC

## 2020-03-11 NOTE — Progress Notes (Signed)
Patient had a Z line measurement of 35cm, Bravo capsule placed at 29 cm without difficulty.   Lot # [id number] Bravo ID # [id number] Exp date 06-14-2021

## 2020-03-11 NOTE — Patient Instructions (Signed)
YOU HAD AN ENDOSCOPIC PROCEDURE TODAY AT [facility]:   Refer to the procedure report that was given to you for any specific questions about what was found during the examination.  If the procedure report does not answer your questions, please call your gastroenterologist to clarify.  If you requested that your care partner not be given the details of your procedure findings, then the procedure report has been included in a sealed envelope for you to review at your convenience later.  YOU SHOULD EXPECT: Some feelings of bloating in the abdomen. Passage of more gas than usual.  Walking can help get rid of the air that was put into your GI tract during the procedure and reduce the bloating. If you had a lower endoscopy (such as a colonoscopy or flexible sigmoidoscopy) you may notice spotting of blood in your stool or on the toilet paper. If you underwent a bowel prep for your procedure, you may not have a normal bowel movement for a few days.  Please Note:  You might notice some irritation and congestion in your nose or some drainage.  This is from the oxygen used during your procedure.  There is no need for concern and it should clear up in a day or so.  SYMPTOMS TO REPORT IMMEDIATELY:  Post-op Bravo pH instructions Once you get home:  Eat normally and go about your daily routine/activities Limit drinking fluids or eating between meals Do not chew gum or eat hard candy DO NOT take any antacid or anti-reflux medications during the 48-hour monitoring time, unless instructed by your physician  Recording events: Events to be recorded are:  Record using event buttons on recorder and write on paper diary form 1. Every time you eat or drink something (other than water) 2.   Periods of lying down/reclining 3.  Symptoms:  may include heartburn, regurgitation, chest pain, cough or specify if other.  A paper diary is also provided to record the times of your reflux symptoms and times for  meals and when you lie down.  The recorder needs to remain within 3 feet (arms length) of you during the testing period (48 hours). If you should forget and move outside of a 3-foot radius of the receiver you may hear beeping and you will see a "C1" error in the display window on the top of the receiver.  Please pick up the receiver and hold close to you to re-establish the connection and the error message disappears.  You may take a bath/shower during the testing period, but the recorder must not get wet and must remain within 3 feet of you. Please leave the receiver outside of the shower or tub while bathing. The monitoring period will be for 48 hours after placement of the capsule.  At the end of the 48 hours, you will return the recorder, and your diary, to our [facility] front desk.  A nurse will meet you to collect the device and answer any questions you may have.  The device should turn off once the 48 hours is complete.   What to expect after placement of the capsule:  Some patients experience a vague sensation that something is in their esophagus or that they 'feel' the capsule when they swallow food.  Should you experience this, chewing food carefully or drinking liquids may minimize this sensation.   After the test is complete, the disposable capsule will fall off the wall of your esophagus within 5-10 days and  pass naturally with your bowel movement through the digestive tract.  Once the recorder is returned, your provider will review and interpret your recordings and contact you to discuss your results.  This may take up to two weeks.   DO NOT have an MRI for 30 days after your procedure to ensure the capsule is no longer inside your body  It is imperative that you return the recorder on _Monday 18 October_ by 3:00pm.  Your information must be downloaded at this time to obtain your results.     Following upper endoscopy (EGD)  Vomiting of blood or coffee ground  material  New chest pain or pain under the shoulder blades  Painful or persistently difficult swallowing  New shortness of breath  Fever of 100F or higher  Black, tarry-looking stools  For urgent or emergent issues, a gastroenterologist can be reached at any hour by calling [phone]. Do not use MyChart messaging for urgent concerns.    DIET:  We do recommend a small meal at first, but then you may proceed to your regular diet.  Drink plenty of fluids but you should avoid alcoholic beverages for 24 hours.  ACTIVITY:  You should plan to take it easy for the rest of today and you should NOT DRIVE or use heavy machinery until tomorrow (because of the sedation medicines used during the test).    FOLLOW UP: Our staff will call the number listed on your records 48-72 hours following your procedure to check on you and address any questions or concerns that you may have regarding the information given to you following your procedure. If we do not reach you, we will leave a message.  We will attempt to reach you two times.  During this call, we will ask if you have developed any symptoms of COVID 19. If you develop any symptoms (ie: fever, flu-like symptoms, shortness of breath, cough etc.) before then, please call [phone].  If you test positive for Covid 19 in the 2 weeks post procedure, please call and report this information to [place].    If any biopsies were taken you will be contacted by phone or by letter within the next 1-3 weeks.  Please call us at [phone] if you have not heard about the biopsies in 3 weeks.    SIGNATURES/CONFIDENTIALITY: You and/or your care partner have signed paperwork which will be entered into your electronic medical record.  These signatures attest to the fact that that the information above on your After Visit Summary has been reviewed and is understood.  Full responsibility of the confidentiality of this discharge information lies with you and/or your  care-partner.

## 2020-03-11 NOTE — Progress Notes (Signed)
VS by CW. ?

## 2020-03-11 NOTE — Progress Notes (Signed)
A/ox3, pleased with MAC, report to RN 

## 2020-03-11 NOTE — Op Note (Signed)
[facility] Patient Name: Lisa Ayers Procedure Date: 03/11/2020 8:20 AM MRN: [id number] Endoscopist: Doristine Locks , MD Age: 39 Referring MD:  Date of Birth: 1980/10/02 Gender: Female Account #: [id number] Procedure:                Upper GI endoscopy with biopsy and BRAVO placement                            (OFF PPI) Indications:              Heartburn, Suspected esophageal reflux,                            Preoperative assessment for potential antireflux                            surgery Medicines:                Monitored Anesthesia Care Procedure:                Pre-Anesthesia Assessment:                           - Prior to the procedure, a History and Physical                            was performed, and patient medications and                            allergies were reviewed. The patient's tolerance of                            previous anesthesia was also reviewed. The risks                            and benefits of the procedure and the sedation                            options and risks were discussed with the patient.                            All questions were answered, and informed consent                            was obtained. Prior Anticoagulants: The patient has                            taken no previous anticoagulant or antiplatelet                            agents. ASA Grade Assessment: II - A patient with                            mild systemic disease. After reviewing the risks  and benefits, the patient was deemed in                            satisfactory condition to undergo the procedure.                           After obtaining informed consent, the endoscope was                            passed under direct vision. Throughout the                            procedure, the patient's blood pressure, pulse, and                            oxygen saturations were monitored continuously. The                             Endoscope was introduced through the mouth, and                            advanced to the second part of duodenum. The upper                            GI endoscopy was accomplished without difficulty.                            The patient tolerated the procedure well. Scope In: Scope Out: Findings:                 The Z-line was found 35 cm from the incisors. There                            was a subtle mucosal break in the Z line. Based on                            clinical history and potential plan for antireflux                            surgery, the decision was made to place a BRAVO                            capsule. The BRAVO capsule with delivery system was                            introduced through the mouth and advanced into the                            esophagus, such that the BRAVO pH capsule was                            positioned 29 cm from the incisors, which was 6 cm  proximal to the GE junction. Suction was applied to                            the well of the BRAVO pH capsule to suck in the                            adjacent mucosa of the esophagus using the external                            vacuum pump set at a minimum vacuum pressure of 550                            mmHg for 45 seconds. The BRAVO pH capsule was then                            deployed by depressing the plunger on top of the                            handle to advance the locking pin into the mucosa,                            thereby attaching the capsule to the esophagus. The                            plunger was then rotated a quarter turn clockwise                            to release the capsule from the delivery system.                            The delivery system was then withdrawn. Endoscopy                            was utilized for probe placement and diagnostic                            evaluation. The endoscope was reinsterted to                             confirma appropriate placement.                           The gastroesophageal flap valve was visualized                            endoscopically and classified as Hill Grade IV (no                            fold, wide open lumen, hiatal hernia present).                           A 2 cm axial height hiatal hernia was  present on                            anterograde views, with significant LES laxity                            noted on retroflexion.                           Segmental mild inflammation characterized by                            erosions and erythema was found in the gastric body                            and in the gastric antrum. Biopsies were taken with                            a cold forceps for Helicobacter pylori testing.                            Estimated blood loss was minimal.                           The examined duodenum was normal. Complications:            No immediate complications. Estimated Blood Loss:     Estimated blood loss was minimal. Impression:               - Z-line mildly irregular with single subtle                            mucosal break, located 35 cm from the incisors.                           - Gastroesophageal flap valve classified as Hill                            Grade IV (no fold, wide open lumen, hiatal hernia                            present).                           - 2 cm hiatal hernia.                           - Gastritis. Biopsied.                           - Normal examined duodenum.                           - The BRAVO pH capsule was positioned 29 cm from                            the  incisors, which was 6 cm proximal to the GE                            junction. Recommendation:           - Patient has a contact number available for                            emergencies. The signs and symptoms of potential                            delayed complications were discussed with the                             patient. Return to normal activities tomorrow.                            Written discharge instructions were provided to the                            patient.                           - Resume previous diet.                           - Continue holding acid suppression therapy for the                            duration of the study. Can otherwise continue other                            present medications.                           - Await pathology results.                           - Return to GI clinic at appointment to be                            scheduled to review results and ongoing treatment                            plan. Doristine Locks, MD 03/11/2020 9:03:09 AM

## 2020-03-11 NOTE — Progress Notes (Signed)
Called to room to assist during endoscopic procedure.  Patient ID and intended procedure confirmed with present staff. Received instructions for my participation in the procedure from the performing physician.  

## 2020-03-15 ENCOUNTER — Telehealth: Payer: Self-pay | Admitting: *Deleted

## 2020-03-15 ENCOUNTER — Telehealth: Payer: Self-pay

## 2020-03-15 DIAGNOSIS — K219 Gastro-esophageal reflux disease without esophagitis: Secondary | ICD-10-CM | POA: Diagnosis not present

## 2020-03-15 DIAGNOSIS — R12 Heartburn: Secondary | ICD-10-CM | POA: Diagnosis not present

## 2020-03-15 NOTE — Telephone Encounter (Signed)
  Follow up Call-  Call back number 03/11/2020  Post procedure Call Back phone  # [phone]  Permission to leave phone message Yes  Some recent data might be hidden     Left message

## 2020-03-15 NOTE — Telephone Encounter (Signed)
First attempt, left VM.  

## 2020-03-17 ENCOUNTER — Telehealth: Payer: Self-pay | Admitting: Gastroenterology

## 2020-03-17 NOTE — Telephone Encounter (Signed)
Bravo study completed on 03/11/2020 (off PPI) and notable for the following:  1.  Normal esophageal acid exposure with % time pH <4 of 2.7%, DeMeester score 10.5 (normal <14.72) 2.  Esophageal exposure is normal in upper and supine position 3.  Positive symptom correlation of heartburn and regurgitation with reflux events based on symptom association probability (SAP)  Impression: No evidence of significant gastroesophageal reflux disease Positive symptom correlation suggested hypersensitive esophagus Consider low-dose PPI and neuromodulator for symptom management  Essentially it demonstrates normal esophageal acid exposure with a normal DeMeester score.  However, she did have symptom correlation with heartburn and regurgitation with reflux events.  This is overall more consistent with esophageal hypersensitivity which can be treated with PPI or potentially adding/replacing with a neuromodulator such as venlafaxine.   I had a long conversation with the patient these results.  She states that she had titrated off of the Protonix well before the study, and has actually made follow-up Protonix since then.  She feels that her reflux symptoms have actually improved somewhat.  Now only takes Pepcid at nighttime due to activity/exercise induced reflux symptoms.  Has otherwise liberalized her diet a bit.  We discussed reflux, esophageal hypersensitivity, hiatal hernia/LES laxity at length today.  Discussed options to include ongoing acid suppression  vs adding/replacing with neuromodulator such as venlafaxine.  Discussed the possibility of worsening hernia and reflux in the future, but given current symptomatology, no plan for surgical intervention at this juncture.  Given overall clinical improvement, will trial maintaining on famotidine alone for now and hold off on introducing venlafaxine.  All questions answered and appreciative of phone call.  Lisa Ayers, can you please cancel the upcoming appointment that  was reserved for preoperative discussion in the event of hiatal hernia repair/TIF?  Thank you.  Can RTC in 4-6 months or sooner as needed

## 2020-03-25 ENCOUNTER — Ambulatory Visit: Payer: No Typology Code available for payment source | Admitting: Certified Nurse Midwife

## 2020-04-04 NOTE — Telephone Encounter (Signed)
Left a message on patients voicemail that her November office visit with Dr Barron Alvine has been cancelled. She was aske to call for any questions. We will see her back in 4-6 months or sooner.

## 2020-04-14 ENCOUNTER — Ambulatory Visit: Payer: No Typology Code available for payment source | Admitting: Gastroenterology

## 2020-07-22 ENCOUNTER — Other Ambulatory Visit (HOSPITAL_COMMUNITY)
Admission: RE | Admit: 2020-07-22 | Discharge: 2020-07-22 | Disposition: A | Discharge status: Home / Self Care | Payer: No Typology Code available for payment source | Source: Ambulatory Visit | Attending: Nurse Practitioner | Admitting: Nurse Practitioner

## 2020-07-22 ENCOUNTER — Other Ambulatory Visit: Payer: Self-pay

## 2020-07-22 ENCOUNTER — Ambulatory Visit (INDEPENDENT_AMBULATORY_CARE_PROVIDER_SITE_OTHER): Payer: No Typology Code available for payment source | Admitting: Nurse Practitioner

## 2020-07-22 ENCOUNTER — Encounter: Payer: Self-pay | Admitting: Nurse Practitioner

## 2020-07-22 VITALS — BP 110/74 | HR 70 | Resp 16 | Ht 66.75 in | Wt 160.0 lb

## 2020-07-22 DIAGNOSIS — Z01419 Encounter for gynecological examination (general) (routine) without abnormal findings: Secondary | ICD-10-CM

## 2020-07-22 DIAGNOSIS — R748 Abnormal levels of other serum enzymes: Secondary | ICD-10-CM

## 2020-07-22 DIAGNOSIS — Z30431 Encounter for routine checking of intrauterine contraceptive device: Secondary | ICD-10-CM

## 2020-07-22 NOTE — Progress Notes (Signed)
40 y.o. G2P1001 Divorced White or Caucasian female here for annual exam & iud removal.     Paragard IUD 12/20/2010, partner getting vasectomy in April Menses are heavy, lasting 5-7 days, has spotting at times (X2 years) but then started "Go with the Flow" Supplement (black cohosh) and spotting resolved. Skipped supplements 2 months ago and started spotting again. Alkaline phosphatase elevated x 5 years. Went off protonix 6 months ago, wants to know if better Works as a Electrical engineer at [employer]  No LMP recorded. (Menstrual status: IUD).          Sexually active: Yes.    The current method of family planning is IUD.    Exercising: Yes.    bike, running Smoker:  no  Health Maintenance: Pap:  02-14-17 neg HPV HR neg,03-13-18 neg History of abnormal Pap:  yes MMG:  11-04-2019 category b density birads 1:neg Colonoscopy:  none BMD:   none TDaP: 2020 Gardasil:   n/a Covid-19: pfizer Hep C testing: not done    reports that she has never smoked. She has never used smokeless tobacco. She reports current alcohol use. She reports that she does not use drugs.  Past Medical History:  Diagnosis Date  . Allergy   . Bilateral bunions   . 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 Carpal tunnel syndrome   . Dysplasia of cervix, low grade (CIN 1)    03/2016  . Eczema   . GERD (gastroesophageal reflux disease)   . Somatic dysfunction of spine, thoracic   . Thoracic back pain   . Urinary tract infection    several prior  . Wears contact lenses     Past Surgical History:  Procedure Laterality Date  . COLPOSCOPY    . INTRAUTERINE DEVICE INSERTION  12/20/10   Paraguard    Current Outpatient Medications  Medication Sig Dispense Refill  . FAMOTIDINE PO Take by mouth.    Marland Kitchen PARAGARD INTRAUTERINE COPPER IU by Intrauterine route.     Marland Kitchen UNABLE TO FIND Go with the flow Hormone balancing supplement     No current facility-administered medications for this visit.    Family History  Problem Relation  Age of Onset  . Depression Mother   . Diabetes Mother   . Thyroid disease Mother   . Glaucoma Mother   . Peripheral vascular disease Mother   . Diabetes Father   . Diabetes Maternal Grandmother   . Stroke Maternal Grandmother   . Heart disease Maternal Grandmother        CABG  . Breast cancer Paternal Grandmother 58          . COPD Paternal Grandmother   . Cancer Cousin 42       breast, estrogen+, BRCA+  . Cancer Cousin        breast  . Colon cancer Neg Hx   . Esophageal cancer Neg Hx     Review of Systems  Constitutional: Negative.   HENT: Negative.   Eyes: Negative.   Respiratory: Negative.   Cardiovascular: Negative.   Gastrointestinal: Negative.   Endocrine: Negative.   Genitourinary: Negative.   Musculoskeletal: Negative.   Skin: Negative.   Allergic/Immunologic: Negative.   Neurological: Negative.   Hematological: Negative.   Psychiatric/Behavioral: Negative.     Exam:   BP 110/74   Pulse 70   Resp 16   Ht 5' 6.75" (1.695 m)   Wt 160 lb (72.6 kg)   BMI 25.25 kg/m   Height: 5'  6.75" (169.5 cm)  General appearance: alert, cooperative and appears stated age, no acute distress Head: Normocephalic, without obvious abnormality Neck: no adenopathy, thyroid normal to inspection and palpation Lungs: clear to auscultation bilaterally Breasts: No axillary or supraclavicular adenopathy, Normal to palpation without dominant masses Heart: regular rate and rhythm Abdomen: soft, non-tender; no masses,  no organomegaly Extremities: extremities normal, no edema Skin: No rashes or lesions Lymph nodes: Cervical, supraclavicular, and axillary nodes normal. No abnormal inguinal nodes palpated Neurologic: Grossly normal   Pelvic: External genitalia:  no lesions              Urethra:  normal appearing urethra with no masses, tenderness or lesions              Bartholins and Skenes: normal                 Vagina: normal appearing vagina, appropriate for age, normal  appearing discharge, no lesions              Cervix: neg cervical motion tenderness, no visible lesions             Bimanual Exam:   Uterus:  normal size, contour, position, consistency, mobility, non-tender              Adnexa: no mass, fullness, tenderness                 Joy, CMA Chaperone was present for exam.  A:   Well woman exam - Plan: Cytology - PAP( Arial)  IUD check up, will keep in place until July 2022  Elevated alkaline phosphatase level - Plan: Comp Met (CMET)   Discussed black cohost can sometimes cause elevation in liver enzymes, will test CMP again and will discuss when results are in.    P:   Pap :  Mammogram:  Labs:  Medications:

## 2020-07-22 NOTE — Patient Instructions (Signed)
Health Maintenance, Female Adopting a healthy lifestyle and getting preventive care are important in promoting health and wellness. Ask your health care provider about:  The right schedule for you to have regular tests and exams.  Things you can do on your own to prevent diseases and keep yourself healthy. What should I know about diet, weight, and exercise? Eat a healthy diet  Eat a diet that includes plenty of vegetables, fruits, low-fat dairy products, and lean protein.  Do not eat a lot of foods that are high in solid fats, added sugars, or sodium.   Maintain a healthy weight Body mass index (BMI) is used to identify weight problems. It estimates body fat based on height and weight. Your health care provider can help determine your BMI and help you achieve or maintain a healthy weight. Get regular exercise Get regular exercise. This is one of the most important things you can do for your health. Most adults should:  Exercise for at least 150 minutes each week. The exercise should increase your heart rate and make you sweat (moderate-intensity exercise).  Do strengthening exercises at least twice a week. This is in addition to the moderate-intensity exercise.  Spend less time sitting. Even light physical activity can be beneficial. Watch cholesterol and blood lipids Have your blood tested for lipids and cholesterol at 41 years of age, then have this test every 5 years. Have your cholesterol levels checked more often if:  Your lipid or cholesterol levels are high.  You are older than 40 years of age.  You are at high risk for heart disease. What should I know about cancer screening? Depending on your health history and family history, you may need to have cancer screening at various ages. This may include screening for:  Breast cancer.  Cervical cancer.  Colorectal cancer.  Skin cancer.  Lung cancer. What should I know about heart disease, diabetes, and high blood  pressure? Blood pressure and heart disease  High blood pressure causes heart disease and increases the risk of stroke. This is more likely to develop in people who have high blood pressure readings, are of African descent, or are overweight.  Have your blood pressure checked: ? Every 3-5 years if you are 18-39 years of age. ? Every year if you are 40 years old or older. Diabetes Have regular diabetes screenings. This checks your fasting blood sugar level. Have the screening done:  Once every three years after age 40 if you are at a normal weight and have a low risk for diabetes.  More often and at a younger age if you are overweight or have a high risk for diabetes. What should I know about preventing infection? Hepatitis B If you have a higher risk for hepatitis B, you should be screened for this virus. Talk with your health care provider to find out if you are at risk for hepatitis B infection. Hepatitis C Testing is recommended for:  Everyone born from 1945 through 1965.  Anyone with known risk factors for hepatitis C. Sexually transmitted infections (STIs)  Get screened for STIs, including gonorrhea and chlamydia, if: ? You are sexually active and are younger than 41 years of age. ? You are older than 41 years of age and your health care provider tells you that you are at risk for this type of infection. ? Your sexual activity has changed since you were last screened, and you are at increased risk for chlamydia or gonorrhea. Ask your health care provider   if you are at risk.  Ask your health care provider about whether you are at high risk for HIV. Your health care provider may recommend a prescription medicine to help prevent HIV infection. If you choose to take medicine to prevent HIV, you should first get tested for HIV. You should then be tested every 3 months for as long as you are taking the medicine. Pregnancy  If you are about to stop having your period (premenopausal) and  you may become pregnant, seek counseling before you get pregnant.  Take 400 to 800 micrograms (mcg) of folic acid every day if you become pregnant.  Ask for birth control (contraception) if you want to prevent pregnancy. Osteoporosis and menopause Osteoporosis is a disease in which the bones lose minerals and strength with aging. This can result in bone fractures. If you are 65 years old or older, or if you are at risk for osteoporosis and fractures, ask your health care provider if you should:  Be screened for bone loss.  Take a calcium or vitamin D supplement to lower your risk of fractures.  Be given hormone replacement therapy (HRT) to treat symptoms of menopause. Follow these instructions at home: Lifestyle  Do not use any products that contain nicotine or tobacco, such as cigarettes, e-cigarettes, and chewing tobacco. If you need help quitting, ask your health care provider.  Do not use street drugs.  Do not share needles.  Ask your health care provider for help if you need support or information about quitting drugs. Alcohol use  Do not drink alcohol if: ? Your health care provider tells you not to drink. ? You are pregnant, may be pregnant, or are planning to become pregnant.  If you drink alcohol: ? Limit how much you use to 0-1 drink a day. ? Limit intake if you are breastfeeding.  Be aware of how much alcohol is in your drink. In the [place] one drink equals one 12 oz bottle of beer (355 mL), one 5 oz glass of wine (148 mL), or one 1 oz glass of hard liquor (44 mL). General instructions  Schedule regular health, dental, and eye exams.  Stay current with your vaccines.  Tell your health care provider if: ? You often feel depressed. ? You have ever been abused or do not feel safe at home. Summary  Adopting a healthy lifestyle and getting preventive care are important in promoting health and wellness.  Follow your health care provider's instructions about healthy  diet, exercising, and getting tested or screened for diseases.  Follow your health care provider's instructions on monitoring your cholesterol and blood pressure. This information is not intended to replace advice given to you by your health care provider. Make sure you discuss any questions you have with your health care provider. Document Revised: 05/07/2018 Document Reviewed: 05/07/2018 Elsevier Patient Education  2021 Elsevier Inc.  

## 2020-07-23 LAB — COMPREHENSIVE METABOLIC PANEL
AG Ratio: 1.7 (calc) (ref 1.0–2.5)
ALT: 14 U/L (ref 6–29)
AST: 18 U/L (ref 10–30)
Albumin: 4.5 g/dL (ref 3.6–5.1)
Alkaline phosphatase (APISO): 179 U/L — ABNORMAL HIGH (ref 31–125)
BUN: 14 mg/dL (ref 7–25)
CO2: 25 mmol/L (ref 20–32)
Calcium: 9.7 mg/dL (ref 8.6–10.2)
Chloride: 104 mmol/L (ref 98–110)
Creat: 0.69 mg/dL (ref 0.50–1.10)
Globulin: 2.6 g/dL (calc) (ref 1.9–3.7)
Glucose, Bld: 88 mg/dL (ref 65–99)
Potassium: 3.8 mmol/L (ref 3.5–5.3)
Sodium: 138 mmol/L (ref 135–146)
Total Bilirubin: 0.4 mg/dL (ref 0.2–1.2)
Total Protein: 7.1 g/dL (ref 6.1–8.1)

## 2020-07-23 LAB — RESULTS CONSOLE HPV: CHL HPV: NEGATIVE

## 2020-07-23 LAB — HM PAP SMEAR: HM Pap smear: NEGATIVE

## 2020-07-25 ENCOUNTER — Other Ambulatory Visit: Payer: Self-pay | Admitting: *Deleted

## 2020-07-25 DIAGNOSIS — Z30432 Encounter for removal of intrauterine contraceptive device: Secondary | ICD-10-CM

## 2020-07-26 LAB — CYTOLOGY - PAP
Comment: NEGATIVE
Diagnosis: NEGATIVE
High risk HPV: NEGATIVE

## 2020-09-28 ENCOUNTER — Ambulatory Visit: Payer: No Typology Code available for payment source | Admitting: Medical

## 2020-09-30 ENCOUNTER — Other Ambulatory Visit: Payer: Self-pay

## 2020-09-30 ENCOUNTER — Ambulatory Visit (INDEPENDENT_AMBULATORY_CARE_PROVIDER_SITE_OTHER): Payer: No Typology Code available for payment source | Admitting: Medical

## 2020-09-30 ENCOUNTER — Encounter: Payer: Self-pay | Admitting: Medical

## 2020-09-30 VITALS — BP 118/74 | HR 75 | Temp 98.0°F | Wt 163.2 lb

## 2020-09-30 DIAGNOSIS — M79645 Pain in left finger(s): Secondary | ICD-10-CM

## 2020-09-30 DIAGNOSIS — M779 Enthesopathy, unspecified: Secondary | ICD-10-CM

## 2020-09-30 DIAGNOSIS — M25641 Stiffness of right hand, not elsewhere classified: Secondary | ICD-10-CM | POA: Diagnosis not present

## 2020-09-30 DIAGNOSIS — M79644 Pain in right finger(s): Secondary | ICD-10-CM

## 2020-09-30 DIAGNOSIS — R748 Abnormal levels of other serum enzymes: Secondary | ICD-10-CM

## 2020-09-30 DIAGNOSIS — M25642 Stiffness of left hand, not elsewhere classified: Secondary | ICD-10-CM

## 2020-09-30 NOTE — Progress Notes (Signed)
Subjective:  Lisa Ayers is a 40 y.o. female who presents for Chief Complaint  Patient presents with  . other    Bumps on both middle fingers maybe arthritis been going on for 1 month or so.     Here for concern of bumps on fingers.  She notes about a month ago she noticed all of a sudden having a bony bump over the knuckle of her middle fingers bilaterally.  She denies any other bony changes.  No swelling of joints.  No other joint pain.  She only has bony changes and pain of her middle fingers bilaterally.  She cannot fully bend the fingers at times but can bend the most of the way.  She gets occasional morning stiffness but nothing significant.  She thinks she has always had some level of stiffness in the mornings like when she gets out of bed but nothing lasting any length of time.   No family history of rheumatoid disease other than she thinks her maternal grandfather had rheumatoid arthritis in his shoulder.  She denies joint swelling.  No fever.  No recent weight change.  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    No other c/o.  Past Medical History:  Diagnosis Date  . Allergy   . Bilateral bunions   . 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 Carpal tunnel syndrome   . Dysplasia of cervix, low grade (CIN 1)    03/2016  . Eczema   . GERD (gastroesophageal reflux disease)   . Somatic dysfunction of spine, thoracic   . Thoracic back pain   . Urinary tract infection    several prior  . Wears contact lenses    Current Outpatient Medications on File Prior to Visit  Medication Sig Dispense Refill  . FAMOTIDINE PO Take by mouth.    Marland Kitchen PARAGARD INTRAUTERINE COPPER IU by Intrauterine route.     Marland Kitchen UNABLE TO FIND Go with the flow Hormone balancing  supplement (Patient not taking: Reported on 09/30/2020)     No current facility-administered medications on file prior to visit.     The following portions of the patient's history were reviewed and updated as appropriate: allergies, current medications, past family history, past medical history, past social history, past surgical history and problem list.  ROS Otherwise as in subjective above    Objective: BP 118/74   Pulse 75   Temp 98 F (36.7 C)   Wt 163 lb 3.2 oz (74 kg)   BMI 25.75 kg/m   General appearance: alert, no distress, well developed, well nourished MSK: PIP dorsally of bilateral middle fingers with a small 3 mm diameter nodular area, and bilateral middle fingers have slightly decreased flexion overall though otherwise hands unremarkable, no other bony changes or swelling.  She does seem to have some increased mobility of joint.  No excess skin necessarily. Pulses: 2+ radial pulses, 2+ pedal pulses, normal cap refill Ext: no edema   Assessment: Encounter Diagnoses  Name Primary?  . Pain in finger of both hands Yes  . Bone spur   . Decreased range of motion of fingers of both hands   . Alkaline phosphatase elevation      Plan: We discussed her symptoms, possible differential.  We will start with sed rate and uric acid level.  Question osteoarthritis versus tophi or other.  Consider finger x-ray of both middle fingers  We discussed possibility of connective tissue disease.  We discussed possibly doing an ANA lab panel.  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  I will reach out to Dr. Barron Alvine, gastroenterology whom she saw this past year for guidance.   [name] was seen today for other.  Diagnoses and all orders for this visit:  Pain in finger of both hands -     Sedimentation rate -     Uric acid -     DG Finger Middle Left; Future -     DG Finger Middle Right; Future  Bone spur -      Sedimentation rate -     Uric acid -     DG Finger Middle Left; Future -     DG Finger Middle Right; Future  Decreased range of motion of fingers of both hands -     Sedimentation rate -     Uric acid -     DG Finger Middle Left; Future -     DG Finger Middle Right; Future  Alkaline phosphatase elevation    Follow up: pending labs

## 2020-10-01 LAB — SEDIMENTATION RATE: Sed Rate: 2 mm/hr (ref 0–32)

## 2020-10-01 LAB — URIC ACID: Uric Acid: 4.3 mg/dL (ref 2.6–6.2)

## 2020-10-07 ENCOUNTER — Telehealth: Payer: Self-pay | Admitting: Medical

## 2020-10-07 NOTE — Telephone Encounter (Signed)
-----   Message from [facility] V, DO sent at 10/04/2020 11:57 AM EDT ----- I hadnt seen those, but that fractionization should be benign. Interestingly, the fractionization the year prior was without intestinal elevation. I would probably check a GGT and 5' nucleotidase as a "tie breaker", and if these are normal, can put this to rest that there's no biliary process that needs further work-up (ie, no need for MRCP).  Thanks!   ----- Message ----- From: Genia Del Sent: 09/30/2020   2:14 PM EDT To: Shellia Cleverly, DO  Hello Dr. Howard Pouch saw our mutual patient.   Any thought on her elevated ALP with intestinal fraction elevated.  Not sure if you had these labs when you saw her

## 2020-10-07 NOTE — Telephone Encounter (Signed)
I heard back from Dr. Barron Alvine about her alkaline phosphatase  His comment was that we could add on to test called  GGT and 5' nucleotidase.  He feels like if these 2 test come back normal then we could disregard any worries about intestines or gastrointestinal issues with the alkaline phosphatase findings  Thus if she would like to come in for these I will put in the order  [facility]

## 2020-10-07 NOTE — Telephone Encounter (Signed)
Sent via mychart

## 2020-10-18 ENCOUNTER — Ambulatory Visit
Admission: RE | Admit: 2020-10-18 | Discharge: 2020-10-18 | Disposition: A | Discharge status: Home / Self Care | Payer: No Typology Code available for payment source | Source: Ambulatory Visit | Attending: Medical | Admitting: Medical

## 2020-10-18 DIAGNOSIS — M779 Enthesopathy, unspecified: Secondary | ICD-10-CM

## 2020-10-18 DIAGNOSIS — M79644 Pain in right finger(s): Secondary | ICD-10-CM

## 2020-10-18 DIAGNOSIS — M79645 Pain in left finger(s): Secondary | ICD-10-CM

## 2020-10-18 DIAGNOSIS — M25641 Stiffness of right hand, not elsewhere classified: Secondary | ICD-10-CM

## 2020-10-20 ENCOUNTER — Other Ambulatory Visit: Payer: Self-pay | Admitting: Medical

## 2020-10-20 DIAGNOSIS — M2550 Pain in unspecified joint: Secondary | ICD-10-CM

## 2020-10-20 DIAGNOSIS — R748 Abnormal levels of other serum enzymes: Secondary | ICD-10-CM

## 2020-11-01 ENCOUNTER — Other Ambulatory Visit: Payer: No Typology Code available for payment source

## 2020-11-01 DIAGNOSIS — R748 Abnormal levels of other serum enzymes: Secondary | ICD-10-CM

## 2020-11-01 DIAGNOSIS — M2550 Pain in unspecified joint: Secondary | ICD-10-CM

## 2020-11-04 LAB — CK TOTAL AND CKMB (NOT AT ARMC)
CK-MB Index: 1.7 ng/mL (ref 0.0–5.3)
Total CK: 83 U/L (ref 32–182)

## 2020-11-04 LAB — NUCLEOTIDASE, 5', BLOOD: 5-Nucleotidase: 4 IU/L (ref 0–10)

## 2020-11-04 LAB — CYCLIC CITRUL PEPTIDE ANTIBODY, IGG/IGA: Cyclic Citrullin Peptide Ab: 7 units (ref 0–19)

## 2020-11-04 LAB — GAMMA GT: GGT: 11 IU/L (ref 0–60)

## 2020-12-13 ENCOUNTER — Other Ambulatory Visit (HOSPITAL_COMMUNITY): Payer: Self-pay

## 2020-12-13 MED ORDER — CEFDINIR 300 MG PO CAPS
ORAL_CAPSULE | ORAL | 0 refills | Status: DC
  Filled 2020-12-13: qty 20, 10d supply, fill #0

## 2020-12-13 MED ORDER — MONTELUKAST SODIUM 10 MG PO TABS
ORAL_TABLET | ORAL | 0 refills | Status: DC
  Filled 2020-12-13: qty 15, 15d supply, fill #0

## 2020-12-15 ENCOUNTER — Ambulatory Visit: Payer: No Typology Code available for payment source | Admitting: Nurse Practitioner

## 2020-12-20 ENCOUNTER — Ambulatory Visit: Payer: No Typology Code available for payment source | Admitting: Nurse Practitioner

## 2020-12-30 ENCOUNTER — Other Ambulatory Visit (HOSPITAL_COMMUNITY): Payer: Self-pay

## 2020-12-30 MED ORDER — METHYLPREDNISOLONE 4 MG PO TBPK
ORAL_TABLET | ORAL | 0 refills | Status: DC
  Filled 2020-12-30: qty 21, 6d supply, fill #0

## 2020-12-30 MED ORDER — MONTELUKAST SODIUM 10 MG PO TABS
ORAL_TABLET | ORAL | 1 refills | Status: DC
  Filled 2020-12-30: qty 30, 30d supply, fill #0

## 2021-01-04 ENCOUNTER — Ambulatory Visit: Payer: No Typology Code available for payment source | Admitting: Obstetrics and Gynecology

## 2021-02-27 ENCOUNTER — Other Ambulatory Visit: Payer: Self-pay

## 2021-02-27 ENCOUNTER — Encounter: Payer: Self-pay | Admitting: Nurse Practitioner

## 2021-02-27 ENCOUNTER — Ambulatory Visit (INDEPENDENT_AMBULATORY_CARE_PROVIDER_SITE_OTHER): Payer: No Typology Code available for payment source | Admitting: Nurse Practitioner

## 2021-02-27 VITALS — BP 118/74

## 2021-02-27 DIAGNOSIS — Z30432 Encounter for removal of intrauterine contraceptive device: Secondary | ICD-10-CM

## 2021-02-27 NOTE — Progress Notes (Signed)
   Acute Office Visit  Subjective:    Patient ID: Lisa Ayers, female    DOB: Oct 22, 1980, 40 y.o.   MRN: [id number]   HPI 40 y.o. presents today for IUD removal. Paraguard inserted 11/2010. Partner received vasectomy in April, so no further contraception needed at this time.   Review of Systems  Constitutional: Negative.   Genitourinary: Negative.       Objective:    Physical Exam Constitutional:      Appearance: Normal appearance.  Genitourinary:    General: Normal vulva.     Vagina: Normal.     Cervix: Normal.    BP 118/74   LMP 02/24/2021  Wt Readings from Last 3 Encounters:  09/30/20 163 lb 3.2 oz (74 kg)  07/22/20 160 lb (72.6 kg)  03/11/20 157 lb (71.2 kg)        Assessment & Plan:   Problem List Items Addressed This Visit   None Visit Diagnoses     Encounter for IUD removal    -  Primary      Plan: IUD strings grasped with rings forceps, removed with ease, shown to patient, and discarded.      Olivia Mackie DNP, 2:37 PM 02/27/2021

## 2021-03-03 LAB — HM MAMMOGRAPHY

## 2021-03-10 ENCOUNTER — Encounter: Payer: Self-pay | Admitting: Internal Medicine

## 2021-05-04 ENCOUNTER — Telehealth (INDEPENDENT_AMBULATORY_CARE_PROVIDER_SITE_OTHER): Payer: No Typology Code available for payment source | Admitting: Family Medicine

## 2021-05-04 ENCOUNTER — Encounter: Payer: Self-pay | Admitting: Family Medicine

## 2021-05-04 ENCOUNTER — Encounter: Payer: Self-pay | Admitting: Internal Medicine

## 2021-05-04 ENCOUNTER — Other Ambulatory Visit: Payer: Self-pay

## 2021-05-04 VITALS — Temp 96.7°F | Ht 66.75 in | Wt 151.0 lb

## 2021-05-04 DIAGNOSIS — R0989 Other specified symptoms and signs involving the circulatory and respiratory systems: Secondary | ICD-10-CM | POA: Diagnosis not present

## 2021-05-04 NOTE — Progress Notes (Signed)
Start time: 12:15 End time: 12:41  Virtual Visit via Video Note  I connected with Lisa Ayers on 05/04/21 by a video enabled telemedicine application and verified that I am speaking with the correct person using two identifiers.  Location: Patient: home Provider: office   I discussed the limitations of evaluation and management by telemedicine and the availability of in person appointments. The patient expressed understanding and agreed to proceed.  History of Present Illness:  Chief Complaint  Patient presents with   Sore Throat    VIRTUAL sore throat that started Tuesday. HA, fatigue and dizziness that started last night. Home covid test this morning was negative. Works for [employer], [facility] had her go for PCR this morning.    2 days ago (12/6) she started with sharp sore throat in the afternoon. Pain was bilateral, and her ears were plugged. Yesterday she started feeling dizzy, weak, more fatigue.  Stayed home from work yest and today Today throat feels better (occ ST).  Currently has fatigue, HA. Mild sinus congestion.  No runny nose, sneeze or cough.   HA is at the top of her head.  She noted feeling LH with walking, HR fast easily. Felt better after a glass of water Last period was last week, had been having heavy cycles, better the last couple of periods since copper IUD out Hg had been too low to give blood when she tried.  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  Feels 10% of how bad she felt with COVID in 11/2020 Had flu shot.  Hasn't yet had bivalent booster.  Negative rapid COVID test, got PCR at work today, pending.  PMH, PSH, SH reviewed  Outpatient Encounter Medications as of 05/04/2021  Medication Sig   FAMOTIDINE PO Take by mouth.   No facility-administered encounter medications on file as of 05/04/2021.   Allergies  Allergen Reactions   Augmentin [Amoxicillin-Pot  Clavulanate]     Upset stomach   Elemental Sulfur Swelling    Of tongue.   ROS:  No f/c.  No body aches (just usual achiness) No n/v/d.  See HPI for URI symptoms, LH, feeling weak.    Observations/Objective:  Temp (!) 96.7 F (35.9 C) (Temporal)   Ht 5' 6.75" (1.695 m)   Wt 151 lb (68.5 kg)   LMP 04/25/2021 (Approximate)   BMI 23.83 kg/m   Well-appearing, pleasant female, in no distress. No sniffling, throat clearing or cough during visit. She is speaking easily and comfortably. Cranial nerves are grossly intact.  Alert and oriented Exam is limited due to virtual nature of the visit.   Assessment and Plan:  Symptoms of upper respiratory infection (URI) - Ddx reviewed, likely viral, doubt flu, COVID PCR pending.  Supportive measures reviewed. Discussed RTW indications (will come from [facility] if +PCR)  Tyl/ibup prn pain/HA/ST Sudafed prn sinuses/ear symptoms Mucinex prn any thick mucus, cough   Follow Up Instructions:    I discussed the assessment and treatment plan with the patient. The patient was provided an opportunity to ask questions and all were answered. The patient agreed with the plan and demonstrated an understanding of the instructions.   The patient was advised to call back or seek an in-person evaluation if the symptoms worsen or if the condition fails to improve as anticipated.  I spent 27 minutes dedicated to the care of this patient, including pre-visit review of records, face  to face time, post-visit ordering of testing and documentation.    Lavonda Jumbo, MD

## 2021-05-05 ENCOUNTER — Encounter: Payer: Self-pay | Admitting: Family Medicine

## 2021-05-05 NOTE — Patient Instructions (Signed)
Drink plenty of water--it is important to stay well hydrated. You may use tylenol and/or ibuprofen as needed for any fever, sore throat, headache/pain. You may use decongestant (ie sudafed) for any sinus pain, or ear pain/popping. Mucinex (guaifenesin) will help if there is any thick drainage or mucus.  If COVID test is +, you will get recommendations regarding return to work through [facility] (though typically it is that if you are feeling significantly better, and afebrile, that you return to work on day 6, wearing N95 at work days 6-10).  It was a pleasure meeting you and I hope you feel better soon!

## 2021-09-30 IMAGING — CR DG FINGER MIDDLE 2+V*R*
3 series · 3 of 3 positions shown · non-contrast
Comparison: None

CLINICAL DATA: Bony joint change of middle finger bilaterally, pain
and decreased range of motion for 3 months

EXAM:
RIGHT MIDDLE FINGER 2+V

[x finger pa right]
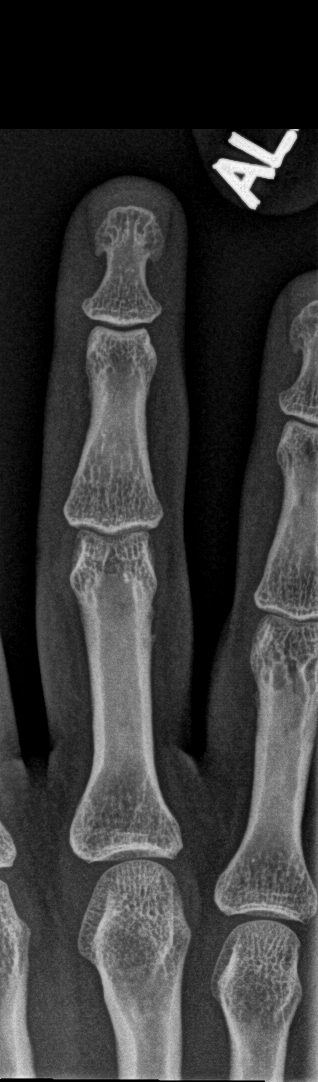

[x finger obl right]
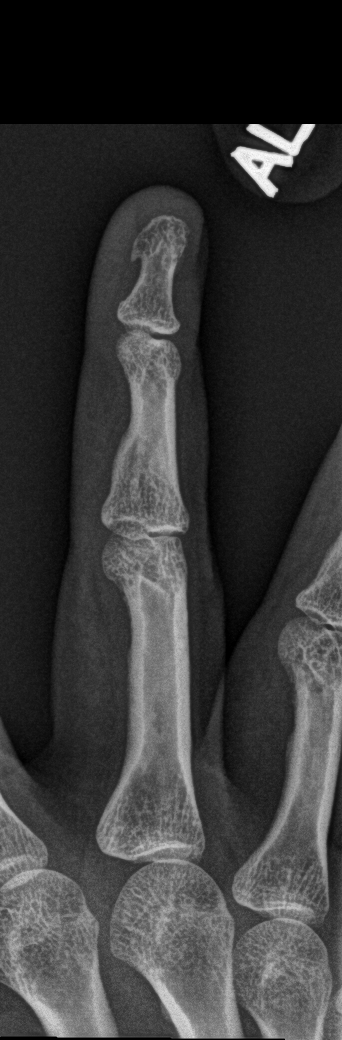

[x finger lat right]
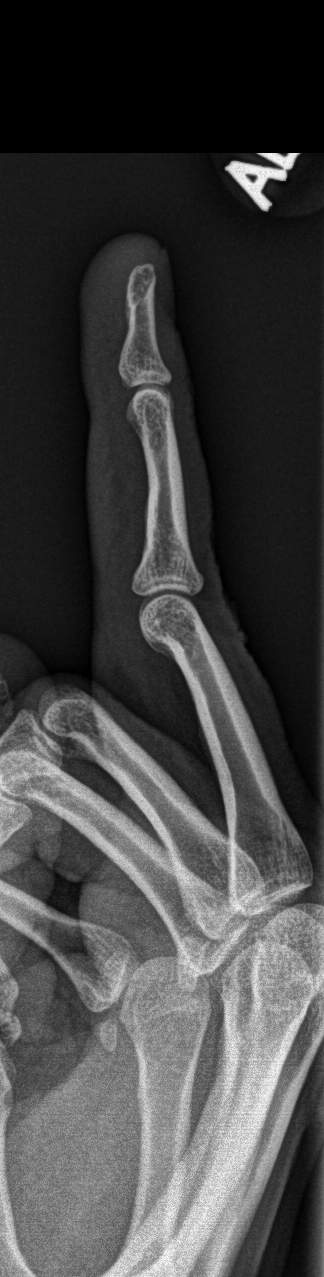

[3 of 3 positions shown; findings below may reference images not displayed]

FINDINGS: Osseous mineralization normal.

Joint spaces preserved.

No fracture, dislocation, or bone destruction.
IMPRESSION: Normal exam.

## 2022-01-18 ENCOUNTER — Ambulatory Visit (INDEPENDENT_AMBULATORY_CARE_PROVIDER_SITE_OTHER): Payer: No Typology Code available for payment source | Admitting: Medical

## 2022-01-18 ENCOUNTER — Other Ambulatory Visit (HOSPITAL_COMMUNITY): Payer: Self-pay

## 2022-01-18 VITALS — BP 110/60 | HR 63 | Temp 97.7°F | Wt 175.0 lb

## 2022-01-18 DIAGNOSIS — M545 Low back pain, unspecified: Secondary | ICD-10-CM

## 2022-01-18 DIAGNOSIS — M62838 Other muscle spasm: Secondary | ICD-10-CM | POA: Diagnosis not present

## 2022-01-18 MED ORDER — MELOXICAM 15 MG PO TABS
15.0000 mg | ORAL_TABLET | Freq: Every day | ORAL | 2 refills | Status: DC
  Filled 2022-01-18: qty 30, 30d supply, fill #0
  Filled 2022-05-17 – 2022-06-29 (×2): qty 30, 30d supply, fill #1

## 2022-01-18 MED ORDER — METHOCARBAMOL 500 MG PO TABS
500.0000 mg | ORAL_TABLET | Freq: Two times a day (BID) | ORAL | 1 refills | Status: DC | PRN
  Filled 2022-01-18: qty 30, 15d supply, fill #0

## 2022-01-18 NOTE — Progress Notes (Signed)
Subjective: Chief Complaint  Patient presents with   strained back    Strained back on left lower side while working out yesterday. Taking ibuprofen but doesn't last long, took tylenol last night. Scretching it and resting   Was exercising yesterday and says she hurt her back.  Has been working with a Psychologist, educational, been doing weights for a while.  Has been gradually increasing weight capacity. Yesterday did a clean and jerk move and tweaked her low back.  She has had prior low back issues .  Prior to yesterday has had some intermittent limited back pains.   Does get stiff in mornings in back.   Has had some tightness in low back prior to this.  No leg numbness , tingling or weakness.  Using some ibuprofen yesterday in morning, and later used some additional ibuprofen.    In general takes ibuprofen 2 OTC tablets nightly for various aches for general discomfort, hip pain at times, other joint pains.  She does have hx/o EDSI back in 2020 for bulging disc in lumbar spine , Dr. Danielle Dess, and didn't really get improvement.   No other aggravating or relieving factors. No other complaint.  Past Medical History:  Diagnosis Date   Allergy    Bilateral bunions    Bulging lumbar disc    L5 S1   Carpal tunnel syndrome    Dysplasia of cervix, low grade (CIN 1)    03/2016   Eczema    GERD (gastroesophageal reflux disease)    Somatic dysfunction of spine, thoracic    Thoracic back pain    Urinary tract infection    several prior   Wears contact lenses    Current Outpatient Medications on File Prior to Visit  Medication Sig Dispense Refill   FAMOTIDINE PO Take by mouth.     No current facility-administered medications on file prior to visit.   ROS as in subjective   Objective: BP 110/60   Pulse 63   Temp 97.7 F (36.5 C)   Wt 175 lb (79.4 kg)   BMI 27.61 kg/m   Gen: wd, wn, nad Back: flexibility is excellent, nontender to palpation, no deformity or swelling Legs nontender with normal range  of motion Legs neurovascularly intact    Assessment: Encounter Diagnoses  Name Primary?   Low back pain without sciatica, unspecified back pain laterality, unspecified chronicity Yes   Muscle spasm     Plan: I suspect her acute pain is spasm and mild strain from where she was doing some weightlifting yesterday.  We discussed short-term.  The rest of the next several days, stretching, can use the medications below for the next several days.  Caution on sedation with muscle laxer.  She notes some ongoing intermittent pains that can be various in her body like hips, back or other.  We discussed considering lap swimming as a way to manage pain, consider a new mattress since her mattress may be 67+ years old.  Consider massage therapy on a somewhat regular basis.  I advised against taking over-the-counter ibuprofen every night or on a frequent basis.  Use the medications below as needed but try doing more lap swimming or other remedy besides having to take medication several evenings per week to help with pain  Lisa Ayers was seen today for strained back.  Diagnoses and all orders for this visit:  Low back pain without sciatica, unspecified back pain laterality, unspecified chronicity  Muscle spasm  Other orders -     meloxicam (MOBIC)  15 MG tablet; Take 1 tablet (15 mg total) by mouth daily. -     methocarbamol (ROBAXIN) 500 MG tablet; Take 1 tablet (500 mg total) by mouth 2 (two) times daily as needed for muscle spasms.    F/u prn

## 2022-02-02 ENCOUNTER — Telehealth: Payer: No Typology Code available for payment source | Admitting: Family Medicine

## 2022-02-02 ENCOUNTER — Other Ambulatory Visit (HOSPITAL_COMMUNITY): Payer: Self-pay

## 2022-02-02 DIAGNOSIS — L255 Unspecified contact dermatitis due to plants, except food: Secondary | ICD-10-CM

## 2022-02-02 MED ORDER — PREDNISONE 10 MG (21) PO TBPK
ORAL_TABLET | ORAL | 0 refills | Status: DC
  Filled 2022-02-02: qty 21, 6d supply, fill #0

## 2022-02-02 NOTE — Progress Notes (Signed)
E Visit for Rash  We are sorry that you are not feeling well. Here is how we plan to help!  Based on what you shared with me it looks like you have contact dermatitis.  Contact dermatitis is a skin rash caused by something that touches the skin and causes irritation or inflammation.  Your skin may be red, swollen, dry, cracked, and itch.  The rash should go away in a few days but can last a few weeks.  If you get a rash, it's important to figure out what caused it so the irritant can be avoided in the future.   Prednisone 10 mg daily for 6 days (see taper instructions below)     HOME CARE:  Take cool showers and avoid direct sunlight. Apply cool compress or wet dressings. Take a bath in an oatmeal bath.  Sprinkle content of one Aveeno packet under running faucet with comfortably warm water.  Bathe for 15-20 minutes, 1-2 times daily.  Pat dry with a towel. Do not rub the rash. Use hydrocortisone cream. Take an antihistamine like Benadryl for widespread rashes that itch.  The adult dose of Benadryl is 25-50 mg by mouth 4 times daily. Caution:  This type of medication may cause sleepiness.  Do not drink alcohol, drive, or operate dangerous machinery while taking antihistamines.  Do not take these medications if you have prostate enlargement.  Read package instructions thoroughly on all medications that you take.  GET HELP RIGHT AWAY IF:  Symptoms don't go away after treatment. Severe itching that persists. If you rash spreads or swells. If you rash begins to smell. If it blisters and opens or develops a yellow-brown crust. You develop a fever. You have a sore throat. You become short of breath.  MAKE SURE YOU:  Understand these instructions. Will watch your condition. Will get help right away if you are not doing well or get worse.  Thank you for choosing an e-visit.  Your e-visit answers were reviewed by a board certified advanced clinical practitioner to complete your personal  care plan. Depending upon the condition, your plan could have included both over the counter or prescription medications.  Please review your pharmacy choice. Make sure the pharmacy is open so you can pick up prescription now. If there is a problem, you may contact your provider through [employer] and have the prescription routed to another pharmacy.  Your safety is important to [place]. If you have drug allergies check your prescription carefully.   For the next 24 hours you can use MyChart to ask questions about today's visit, request a non-urgent call back, or ask for a work or school excuse. You will get an email in the next two days asking about your experience. I hope that your e-visit has been valuable and will speed your recovery.    have provided 5 minutes of non face to face time during this encounter for chart review and documentation.

## 2022-03-06 ENCOUNTER — Encounter: Payer: Self-pay | Admitting: Internal Medicine

## 2022-03-30 LAB — HM MAMMOGRAPHY

## 2022-04-03 ENCOUNTER — Encounter: Payer: Self-pay | Admitting: Internal Medicine

## 2022-04-06 ENCOUNTER — Encounter: Payer: Self-pay | Admitting: Medical

## 2022-04-29 ENCOUNTER — Telehealth: Payer: No Typology Code available for payment source | Admitting: Family

## 2022-04-29 DIAGNOSIS — T63441A Toxic effect of venom of bees, accidental (unintentional), initial encounter: Secondary | ICD-10-CM

## 2022-04-29 MED ORDER — PREDNISONE 20 MG PO TABS: 40.0000 mg | ORAL_TABLET | Freq: Every day | ORAL | 0 refills | Status: AC

## 2022-04-29 MED ORDER — PREDNISONE 20 MG PO TABS: 40.0000 mg | ORAL_TABLET | Freq: Every day | ORAL | 0 refills | Status: DC

## 2022-04-29 NOTE — Progress Notes (Signed)
E-Visit for Insect Sting  Thank you for describing the insect sting for [place].  Here is how we plan to help!  An uncomplicated insect sting that just occurred and can be closely followed using the instructions in your care plan.  The 2 greatest risks from insect stings are allergic reaction, which can be fatal in some people and infection, which is more common and less serious.  Bees, wasps, yellow jackets, and hornets belong to a class of insects called Hymenoptera.  Most insect stings cause only minor discomfort.  Stings can happen anywhere on the body and can be painful.  Most stings are from honey bees or yellow jackets.  Fire ants can sting multiple times.  The sites of the stings are more likely to become infected.    Based on your information I have:, Provided a home care guide for insect stings and instructions on when to call for help., and I have sent in prednisone 40 mg by mouth daily for 5 days to the pharmacy you selected.  Please make sure that you selected a pharmacy that is open now.  What can be used to prevent Insect Stings?  Insect repellant with at least 20% DEET.  Wearing long pants and shirts with socks and shoes.  Wear dark or drab-colored clothes rather than bright colors.  Avoid using perfumes and hair sprays; these attract insects.  HOME CARE ADVICE:  1. Stinger removal: The stinger looks like a tiny black dot in the sting. Use a fingernail, credit card edge, or knife-edge to scrape it off.  Don't pull it out because it squeezes out more venom. If the stinger is below the skin surface, leave it alone.  It will be shed with normal skin healing. 2. Use cold compresses to the area of the sting for 10-20 minutes.  You may repeat this as needed to relieve symptoms of pain and swelling. 3.  For pain relief, take acetominophen 650 mg 4-6 hours as needed or ibuprofen 400 mg every 6-8 hours as needed or naproxen 250-500 mg every 12 hours as needed. 4.  You can also use  hydrocortisone cream 0.5% or 1% up to 4 times daily as needed for itching. 5.  If the sting becomes very itchy, take Benadryl 25-50 mg, follow directions on box. 6.  Wash the area 2-3 times daily with antibacterial soap and warm water. 7. Call your Doctor if: Fever, a severe headache, or rash occur in the next 2 weeks. Sting area begins to look infected. Redness and swelling worsens after home treatment. Your current symptoms become worse.    MAKE SURE YOU:  Understand these instructions. Will watch your condition. Will get help right away if you are not doing well or get worse.  Thank you for choosing an e-visit.  Your e-visit answers were reviewed by a board certified advanced clinical practitioner to complete your personal care plan. Depending upon the condition, your plan could have included both over the counter or prescription medications.  Please review your pharmacy choice. Make sure the pharmacy is open so you can pick up prescription now. If there is a problem, you may contact your provider through [employer] and have the prescription routed to another pharmacy.  Your safety is important to [place]. If you have drug allergies check your prescription carefully.   For the next 24 hours you can use MyChart to ask questions about today's visit, request a non-urgent call back, or ask for a work or school excuse. You  will get an email in the next two days asking about your experience. I hope that your e-visit has been valuable and will speed your recovery.  Approximately 5 minutes was spent documenting and reviewing patient's chart.

## 2022-04-29 NOTE — Addendum Note (Signed)
Addended by: Jannifer Rodney A on: 04/29/2022 07:02 PM   Modules accepted: Orders

## 2022-05-08 ENCOUNTER — Other Ambulatory Visit: Payer: Self-pay | Admitting: *Deleted

## 2022-05-08 ENCOUNTER — Telehealth (INDEPENDENT_AMBULATORY_CARE_PROVIDER_SITE_OTHER): Payer: No Typology Code available for payment source | Admitting: Medical

## 2022-05-08 ENCOUNTER — Encounter: Payer: Self-pay | Admitting: Medical

## 2022-05-08 VITALS — Temp 98.7°F | Ht 68.0 in | Wt 155.0 lb

## 2022-05-08 DIAGNOSIS — M76892 Other specified enthesopathies of left lower limb, excluding foot: Secondary | ICD-10-CM | POA: Diagnosis not present

## 2022-05-08 DIAGNOSIS — M722 Plantar fascial fibromatosis: Secondary | ICD-10-CM | POA: Diagnosis not present

## 2022-05-08 NOTE — Progress Notes (Signed)
Subjective:     Patient ID: Lisa Ayers, female   DOB: 02/27/1981, 41 y.o.   MRN: [id number]  This visit type was conducted due to national recommendations for restrictions regarding the COVID-19 Pandemic (e.g. social distancing) in an effort to limit this patient's exposure and mitigate transmission in our community.  Due to their co-morbid illnesses, this patient is at least at moderate risk for complications without adequate follow up.  This format is felt to be most appropriate for this patient at this time.    Documentation for virtual audio and video telecommunications through [place] encounter:  The patient was located at home. The provider was located in the office. The patient did consent to this visit and is aware of possible charges through their insurance for this visit.  The other persons participating in this telemedicine service were none. Time spent on call was 20 minutes and in review of previous records 20 minutes total.  This virtual service is not related to other E/M service within previous 7 days.   HPI Chief Complaint  Patient presents with   Consult    VIRTUAL she is fairly sure she has plantar facsciitis of the L foot and would like to see if you would order PT through [facility], Dr. Pearletha Forge.   Virtual consult regarding plantar fasciitis.  She has left-sided plantar fasciitis problems ongoing for a while now at least 6 months.  Prior she had pain as soon as she stepped out of the bed.  Lately she just has ongoing problems with foot pain.  She has tried some new shoes which helped and has some new heel inserts that the foot store help her with.  She has tried a sock type splint at night that did not help and was aggravating.  She saw acupuncturist recently who did some dry needling with some benefit.  She would like to see physical therapy.  She also tries icing, rolling, stretching.  She also has some issues with her hip flexor on the left as well  and thinks it is related to her foot. No other aggravating or relieving factors. No other complaint.  Review of Systems As in subjective    Objective:   Physical Exam Due to coronavirus pandemic stay at home measures, patient visit was virtual and they were not examined in person.   Temp 98.7 F (37.1 C) (Temporal)   Ht 5\' 8"  (1.727 m)   Wt 155 lb (70.3 kg)   LMP 05/01/2022 (Exact Date)   BMI 23.57 kg/m   Gen: wd, wn, nad      Assessment:     Encounter Diagnoses  Name Primary?   Plantar fasciitis Yes   Tendinitis of left hip flexor        Plan:     We discussed the concerns and symptoms and treatment recommendations.  Referral to physical therapy.  Patient Instructions   Recommendations:  Plantar fascitis Every morning try doing a tennis ball massage with feet on the floor and towel stretch behind the ball of the foot to stretch the plantar fascia Order plantar fascitis night time 90 degree splints online, through 14/09/2021 for example.   They are typically about $25 each Avoid going barefoot since your feet flatten out without good arch support I recommend you use arch support shoe INSERTS.   This will help support the plantar fascia and arches You can use over the counter pain reliever for worse pain    Plantar Fasciitis Plantar fasciitis  is a painful foot condition that affects the heel. It occurs when the band of tissue that connects the toes to the heel bone (plantar fascia) becomes irritated. This can happen after exercising too much or doing other repetitive activities (overuse injury). The pain from plantar fasciitis can range from mild irritation to severe pain that makes it difficult for you to walk or move. The pain is usually worse in the morning or after you have been sitting or lying down for a while. What are the causes? This condition may be caused by: Standing for long periods of time. Wearing shoes that do not fit. Doing high-impact activities,  including running, aerobics, and ballet. Being overweight. Having an abnormal way of walking (gait). Having tight calf muscles. Having high arches in your feet. Starting a new athletic activity.  What are the signs or symptoms? The main symptom of this condition is heel pain. Other symptoms include: Pain that gets worse after activity or exercise. Pain that is worse in the morning or after resting. Pain that goes away after you walk for a few minutes.  How is this diagnosed? This condition may be diagnosed based on your signs and symptoms. Your health care provider will also do a physical exam to check for: A tender area on the bottom of your foot. A high arch in your foot. Pain when you move your foot. Difficulty moving your foot.  You may also need to have imaging studies to confirm the diagnosis. These can include: X-rays. Ultrasound. MRI.  How is this treated? Treatment for plantar fasciitis depends on the severity of the condition. Your treatment may include: Rest, ice, and over-the-counter pain medicines to manage your pain. Exercises to stretch your calves and your plantar fascia. A splint that holds your foot in a stretched, upward position while you sleep (night splint). Physical therapy to relieve symptoms and prevent problems in the future. Cortisone injections to relieve severe pain. Extracorporeal shock wave therapy (ESWT) to stimulate damaged plantar fascia with electrical impulses. It is often used as a last resort before surgery. Surgery, if other treatments have not worked after 12 months.  Follow these instructions at home: Take medicines only as directed by your health care provider. Avoid activities that cause pain. Roll the bottom of your foot over a bag of ice or a bottle of cold water. Do this for 20 minutes, 3-4 times a day. Perform simple stretches as directed by your health care provider. Try wearing athletic shoes with air-sole or gel-sole cushions  or soft shoe inserts. Wear a night splint while sleeping, if directed by your health care provider. Keep all follow-up appointments with your health care provider. How is this prevented? Do not perform exercises or activities that cause heel pain. Consider finding low-impact activities if you continue to have problems. Lose weight if you need to. The best way to prevent plantar fasciitis is to avoid the activities that aggravate your plantar fascia. Contact a health care provider if: Your symptoms do not go away after treatment with home care measures. Your pain gets worse. Your pain affects your ability to move or do your daily activities. This information is not intended to replace advice given to you by your health care provider. Make sure you discuss any questions you have with your health care provider. Document Released: 02/06/2001 Document Revised: 10/17/2015 Document Reviewed: 03/24/2014 Elsevier Interactive Patient Education  2018 [place] was seen today for consult.  Diagnoses and  all orders for this visit:  Plantar fasciitis  Tendinitis of left hip flexor    F/u pending referral

## 2022-05-08 NOTE — Patient Instructions (Signed)
Recommendations:  Plantar fascitis  Every morning try doing a tennis ball massage with feet on the floor and towel stretch behind the ball of the foot to stretch the plantar fascia  Order plantar fascitis night time 90 degree splints online, through [employer] for example.   They are typically about $25 each  Avoid going barefoot since your feet flatten out without good arch support  I recommend you use arch support shoe INSERTS.   This will help support the plantar fascia and arches  You can use over the counter pain reliever for worse pain    Plantar Fasciitis Plantar fasciitis is a painful foot condition that affects the heel. It occurs when the band of tissue that connects the toes to the heel bone (plantar fascia) becomes irritated. This can happen after exercising too much or doing other repetitive activities (overuse injury). The pain from plantar fasciitis can range from mild irritation to severe pain that makes it difficult for you to walk or move. The pain is usually worse in the morning or after you have been sitting or lying down for a while. What are the causes? This condition may be caused by:  Standing for long periods of time.  Wearing shoes that do not fit.  Doing high-impact activities, including running, aerobics, and ballet.  Being overweight.  Having an abnormal way of walking (gait).  Having tight calf muscles.  Having high arches in your feet.  Starting a new athletic activity.  What are the signs or symptoms? The main symptom of this condition is heel pain. Other symptoms include:  Pain that gets worse after activity or exercise.  Pain that is worse in the morning or after resting.  Pain that goes away after you walk for a few minutes.  How is this diagnosed? This condition may be diagnosed based on your signs and symptoms. Your health care provider will also do a physical exam to check for:  A tender area on the bottom of your foot.  A high  arch in your foot.  Pain when you move your foot.  Difficulty moving your foot.  You may also need to have imaging studies to confirm the diagnosis. These can include:  X-rays.  Ultrasound.  MRI.  How is this treated? Treatment for plantar fasciitis depends on the severity of the condition. Your treatment may include:  Rest, ice, and over-the-counter pain medicines to manage your pain.  Exercises to stretch your calves and your plantar fascia.  A splint that holds your foot in a stretched, upward position while you sleep (night splint).  Physical therapy to relieve symptoms and prevent problems in the future.  Cortisone injections to relieve severe pain.  Extracorporeal shock wave therapy (ESWT) to stimulate damaged plantar fascia with electrical impulses. It is often used as a last resort before surgery.  Surgery, if other treatments have not worked after 12 months.  Follow these instructions at home:  Take medicines only as directed by your health care provider.  Avoid activities that cause pain.  Roll the bottom of your foot over a bag of ice or a bottle of cold water. Do this for 20 minutes, 3-4 times a day.  Perform simple stretches as directed by your health care provider.  Try wearing athletic shoes with air-sole or gel-sole cushions or soft shoe inserts.  Wear a night splint while sleeping, if directed by your health care provider.  Keep all follow-up appointments with your health care provider. How is this prevented?    Do not perform exercises or activities that cause heel pain.  Consider finding low-impact activities if you continue to have problems.  Lose weight if you need to. The best way to prevent plantar fasciitis is to avoid the activities that aggravate your plantar fascia. Contact a health care provider if:  Your symptoms do not go away after treatment with home care measures.  Your pain gets worse.  Your pain affects your ability to move  or do your daily activities. This information is not intended to replace advice given to you by your health care provider. Make sure you discuss any questions you have with your health care provider. Document Released: 02/06/2001 Document Revised: 10/17/2015 Document Reviewed: 03/24/2014 Elsevier Interactive Patient Education  2018 Elsevier Inc.    

## 2022-05-29 DIAGNOSIS — M25579 Pain in unspecified ankle and joints of unspecified foot: Secondary | ICD-10-CM | POA: Diagnosis not present

## 2022-06-01 ENCOUNTER — Other Ambulatory Visit (HOSPITAL_COMMUNITY): Payer: Self-pay

## 2022-06-05 DIAGNOSIS — M25579 Pain in unspecified ankle and joints of unspecified foot: Secondary | ICD-10-CM | POA: Diagnosis not present

## 2022-06-29 ENCOUNTER — Other Ambulatory Visit (HOSPITAL_COMMUNITY): Payer: Self-pay

## 2022-07-02 ENCOUNTER — Other Ambulatory Visit (HOSPITAL_COMMUNITY): Payer: Self-pay

## 2022-07-13 ENCOUNTER — Encounter: Payer: Self-pay | Admitting: Chiropractic Medicine

## 2022-09-14 ENCOUNTER — Other Ambulatory Visit (HOSPITAL_COMMUNITY): Payer: Self-pay

## 2022-09-14 ENCOUNTER — Encounter: Payer: Self-pay | Admitting: Family Medicine

## 2022-09-14 ENCOUNTER — Ambulatory Visit: Payer: 59 | Admitting: Family Medicine

## 2022-09-14 VITALS — BP 110/72 | Ht 67.0 in | Wt 165.0 lb

## 2022-09-14 DIAGNOSIS — M722 Plantar fascial fibromatosis: Secondary | ICD-10-CM

## 2022-09-14 DIAGNOSIS — M25552 Pain in left hip: Secondary | ICD-10-CM | POA: Diagnosis not present

## 2022-09-14 MED ORDER — GABAPENTIN 100 MG PO CAPS
ORAL_CAPSULE | ORAL | 1 refills | Status: DC
  Filled 2022-09-14: qty 90, 30d supply, fill #0
  Filled 2022-09-14: qty 90, 35d supply, fill #0

## 2022-09-14 MED ORDER — NITROGLYCERIN 0.2 MG/HR TD PT24
MEDICATED_PATCH | TRANSDERMAL | 1 refills | Status: DC
  Filled 2022-09-14: qty 30, 30d supply, fill #0

## 2022-09-14 NOTE — Patient Instructions (Signed)
Cut patch into one - fourth pieces Place a one fourth piece of patch on  skin over affected area, changing to a new piece every 24 hours.  You may experience a headache during the first 1-2 days and maybe up to 2 weeks of using the patch; these should improve and go away. If you experience headaches after beginning nitroglycerin patch treatment, you may take your preferred over the counter pain medicine such as tylenol or advil as directed on the box. Another side effect of the nitroglycerin patch can be skin irritation  or rash related to the patch adhesive.   Please notify our office if you develop  severe headaches or rash, and stop the patch.  Please call our office with any questions or problems. Tendon healing with nitroglycerin patch may require 12 to 24 weeks depending on the extent of injury. Men should not use if taking Viagra, Cialis, or Levitra as it may cause an unsafe lowering of the blood pressure..  Use with caution if you have migraines or rosacea.   NO DEADLIFTS or SQUATS or ANYTHING THAT AGGRAVATES

## 2022-09-14 NOTE — Assessment & Plan Note (Signed)
She certainly does all of the conservative measures.  She has a fairly normal foot and I do not think orthotics would help her.  She is already been through physical therapy.  Will start nitroglycerin patch.  Since her also starting gabapentin next week, I would wait a week or 2 before I started the patch.  I will see her back in 3 to 4 weeks, sooner with problems.

## 2022-09-14 NOTE — Assessment & Plan Note (Signed)
Slightly atypical hamstring type pain.  I am curious if this is just a chronic hamstring strain or something coming from her back.  We discussed these options.  She says it does not feel like the pain she had previously from her back.  We will start her on home exercise program for hamstring strengthening.  We will discontinue some of her current exercises such as the Roman dead lift.  She is already discontinued squats.  We will start gabapentin 100 mg and increase to 300 mg over the next couple of weeks as tolerated.  I will see her back.  If you are not making any progress in 4 to 6 weeks, would consider reevaluating the low back.

## 2022-09-14 NOTE — Progress Notes (Signed)
  Lisa Ayers - 42 y.o. female MRN [id number]  Date of birth: Feb 22, 1981    SUBJECTIVE:      Chief Complaint:/ HPI:  1 year ago left hamstring pain.  She vaguely recalls acute onset of pain at the origin of the hamstring almost a year ago.  It was intense at first and then became a little bit more chronic.  She has done quite a few things such as chiropractic, physical therapy, home exercise program including formal physical therapy and it has not helped.  Pain is pretty chronic 4-5 out of 10.  She is continuing to be active but has changed many of her activities such as her workout so that she does not aggravate it.  None of the interventions have helped. 2.  Left foot pain that she thinks is Planter fasciitis.  This is also been ongoing for several months.  Difficult lancinating pain first thing in the morning when she takes her first step.  She has done stretches, exercises, had special inserts made, seeing the podiatrist and seeing the chiropractor.  This continues to be a problem for her.  She is quite frustrated.  PERTINENT  PMH / PSH: I have reviewed the patient's medications, allergies, past medical and surgical history, smoking status.  Pertinent findings that relate to today's visit / issues include: MRI 2021 showed small herniated disc on the left.  She saw Dr. Danielle Dess at neurosurgery who did not think this was problematic.  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  She did have 2 epidural nerve root injections at that time which seem to help.  OBJECTIVE: BP 110/72   Ht  (1.702 m)   Wt 165 lb (74.8 kg)   BMI 25.84 kg/m   Physical Exam:  Vital signs are reviewed. GENERAL: Well-developed female no acute distress GAIT: Slight out-toeing on the left and forward swing phase but normal non- antalgic gait otherwise. FEET: Slightly high arch.  Tender to palpation at  the origin of the plantar fascia. HIPS: Symmetrical.  Negative Trendelenburg. Caps MSK: Left hamstring has equal strength of the right.  On resistance testing she does have a little cramping with extended contraction but no specific pain.  Is essentially nontender to palpation at the origin.  The muscles without defect.  ASSESSMENT & PLAN:  See problem based charting & AVS for pt instructions. No problem-specific Assessment & Plan notes found for this encounter.

## 2022-10-11 ENCOUNTER — Ambulatory Visit: Payer: 59 | Admitting: Sports Medicine

## 2022-10-11 VITALS — BP 104/78 | Ht 67.0 in | Wt 165.0 lb

## 2022-10-11 DIAGNOSIS — M722 Plantar fascial fibromatosis: Secondary | ICD-10-CM | POA: Diagnosis not present

## 2022-10-11 DIAGNOSIS — M25552 Pain in left hip: Secondary | ICD-10-CM

## 2022-10-11 NOTE — Progress Notes (Signed)
   Subjective:    Patient ID: Lisa Ayers, female    DOB: [birth year], 42 y.o.   MRN: [id number]  HPI  Patient is a 42 year old woman with a PMH of  anemia, GERD, rhinitis, who presents for follow-up appointment after patient saw Dr. Jennette Kettle roughly 1 month ago for left hamstring pain and L planter fasciitis.  Both of these issues have been bothering patient since the fall of last year.  She has tried a variety of treatment modalities for presumed diagnosis of hamstring tendinitis including physical therapy, home exercises, gabapentin, and resting from her usual exercise routine which includes tennis, weightlifting and running.  She reports that she has been doing her exercises recently but says they always causes a pain "flareup" around her ischial tuberosity (and even more medially around abductors attachments).  No longer taking gabapentin as she said it made her too tired. Patient is also been undergone treatment for Planter fasciitis including nitroglycerin patch, nighttime dorsiflexion splinting, [place] socks, orthotics and seeing a chiropractor who reportedly performed shockwave therapy, manipulation, and dry needling.  Patient states she has noticed the greatest benefit since splinting foot at nighttime and while sitting for prolonged periods of time.  While plantar fascia pain is somewhat improved she is frustrated that her hamstring pain is unchanged since initial injury last fall.  Review of Systems  Negative except as noted above    Objective:   Physical Exam Constitutional:      General: She is not in acute distress.    Appearance: Normal appearance.  HENT:     Nose: Nose normal.  Eyes:     Extraocular Movements: Extraocular movements intact.     Conjunctiva/sclera: Conjunctivae normal.  Pulmonary:     Effort: Pulmonary effort is normal.  Musculoskeletal:        General: Tenderness and signs of injury (chronic) present. Normal range of motion.     Cervical back: Normal range  of motion.     Comments: Tenderness over L ischial tuberosity that worsen with resisted leg flexion  Pain of medial calcaneous along attachment of  plantar fasciitis. Worsened by great toe extension  Skin:    General: Skin is warm and dry.  Neurological:     General: No focal deficit present.     Mental Status: She is alert. Mental status is at baseline.  Psychiatric:        Mood and Affect: Mood normal.        Behavior: Behavior normal.            Assessment & Plan:   Left tendinitis  Will get x-ray of AP pelvis today.  Plan to undergo MRI of pelvis to rule out tendon tear (adductor vs hamstring).  This has been a chronic injury since last fall and has not resolved with treatment modalities including PT/home exercises, anti-inflammatories, gabapentin, prolonged rest.  Will further stratify using advanced imaging modalities with plan to undergo targeted therapy with PT thereafter, assuming no complete tear or other findings. I will MyChart messgae her with those results.  2. Left Plantar Fascitis  Continue nighttime splinting Continue nitroglycerin patches Continue daily stretching  Can incorporate scraping/massage techniques to help further break up plantar fascial adhesions Patient had previously worn orthotics which she denies being helpful.  Continue wearing neutral shoe with adequate arch support.  This note was dictated by Havery Moros, DO, PGY 2 and amended by me. Marsa Aris, DO

## 2022-10-12 ENCOUNTER — Ambulatory Visit
Admission: RE | Admit: 2022-10-12 | Discharge: 2022-10-12 | Disposition: A | Discharge status: Home / Self Care | Payer: 59 | Source: Ambulatory Visit | Attending: Sports Medicine | Admitting: Sports Medicine

## 2022-10-12 DIAGNOSIS — M79651 Pain in right thigh: Secondary | ICD-10-CM | POA: Diagnosis not present

## 2022-10-12 DIAGNOSIS — M25552 Pain in left hip: Secondary | ICD-10-CM

## 2022-10-17 ENCOUNTER — Encounter: Payer: Self-pay | Admitting: Sports Medicine

## 2022-10-19 ENCOUNTER — Ambulatory Visit
Admission: RE | Admit: 2022-10-19 | Discharge: 2022-10-19 | Disposition: A | Discharge status: Home / Self Care | Payer: 59 | Source: Ambulatory Visit | Attending: Sports Medicine | Admitting: Sports Medicine

## 2022-10-19 DIAGNOSIS — M25859 Other specified joint disorders, unspecified hip: Secondary | ICD-10-CM | POA: Diagnosis not present

## 2022-10-19 DIAGNOSIS — G96191 Perineural cyst: Secondary | ICD-10-CM | POA: Diagnosis not present

## 2022-10-19 DIAGNOSIS — M25552 Pain in left hip: Secondary | ICD-10-CM

## 2022-10-19 DIAGNOSIS — M5136 Other intervertebral disc degeneration, lumbar region: Secondary | ICD-10-CM | POA: Diagnosis not present

## 2022-10-19 DIAGNOSIS — R2242 Localized swelling, mass and lump, left lower limb: Secondary | ICD-10-CM | POA: Diagnosis not present

## 2022-10-19 DIAGNOSIS — M79651 Pain in right thigh: Secondary | ICD-10-CM | POA: Diagnosis not present

## 2022-10-31 ENCOUNTER — Encounter: Payer: Self-pay | Admitting: Sports Medicine

## 2022-11-02 ENCOUNTER — Other Ambulatory Visit: Payer: Self-pay

## 2022-11-02 DIAGNOSIS — M25552 Pain in left hip: Secondary | ICD-10-CM

## 2022-11-14 ENCOUNTER — Other Ambulatory Visit: Payer: Self-pay

## 2022-11-14 ENCOUNTER — Ambulatory Visit (INDEPENDENT_AMBULATORY_CARE_PROVIDER_SITE_OTHER): Payer: 59 | Admitting: Orthopedic Surgery

## 2022-11-14 ENCOUNTER — Encounter: Payer: Self-pay | Admitting: Orthopedic Surgery

## 2022-11-14 VITALS — BP 110/75 | HR 72 | Ht 67.0 in | Wt 165.0 lb

## 2022-11-14 DIAGNOSIS — M549 Dorsalgia, unspecified: Secondary | ICD-10-CM

## 2022-11-14 DIAGNOSIS — G8929 Other chronic pain: Secondary | ICD-10-CM

## 2022-11-14 NOTE — Progress Notes (Signed)
Orthopedic Surgery Progress Note   Assessment: Patient is a 42 y.o. female with left plantar fasciitis and left proximal posterior thigh pain   Plan: -Patient has had pain in her left foot for over 1 year now, so I am referring her to Dr. Lajoyce Corners to evaluate -She has tried ice, heel pads, OTC medications, PT, stretching, brace use -In regards to her left posterior thigh pain, it seems to be slightly off where I would expect tuberosity pain. Talked about doing an injection to that area for diagnostic purposes. Will hold off on that for now as we are starting with the plantar fascia -She had a MRI in 2020 of her lumbar spine that showed a left paraspinal disc herniation that appears similar to the MRI pelvis that was done.  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  Accordingly, I do not suspect this as her pain generator but could consider injection in the future for diagnostic purposes as well -Follow up on an as needed basis  ___________________________________________________________________________  Subjective: Patient has had over 1 year of left proximal posterior thigh pain and left heel pain.  She does remember moving and injuring it but she cannot recall the exact movement.  There is no fall or high-energy mechanism of injury.  She has seen 3 providers previously about this problem.  She states she has had a disc herniation at L5-S1 and has had that previously injected but that did not help with her lumbar paraspinal pain in the past.  This is a new pain that she feels in the posterior proximal left thigh.  It does not radiate past that point.  She says it is worse if she is sitting especially driving.  She is also had pain in her left foot in the area of the heel.  She has been diagnosed with plantar fasciitis.  She has done multiple conservative treatments for this and has not gotten relief with any of those.  The only thing that she has found that helps is if she sleeps in a brace with her  foot at 90 degrees.  She has not tried an injection yet for her plantar fasciitis and has not had any surgery to the area.   Physical Exam:  BMI of 25.8  General: no acute distress, appears stated age Neurologic: alert, answering questions appropriately, following commands Respiratory: unlabored breathing on room air, symmetric chest rise Psychiatric: appropriate affect, normal cadence to speech  MSK:   -Left lower extremity  TTP over the medial distal heel near the insertion of the plantar fascia, no other tenderness to palpation over the foot/ankle, no TTP over the ischial tuberosity or the posterior proximal thigh.  Negative straight leg raise EHL/TA/GSC intact Plantarflexes and dorsiflexes toes Sensation intact to light touch in sural, saphenous, tibial, deep peroneal, and superficial peroneal nerve distributions Foot warm and well perfused  Imaging:  AP pelvis x-ray from 10/12/2022 was independently reviewed and interpreted, showing no significant degenerative changes within the hip joints bilaterally.  No fracture or dislocation seen.  No avulsion fracture seen.  MRI of the pelvis was reviewed: small left paracentral disc herniation at L5/S1.  Large perineural cyst.  Edema along the left proximal hamstring near the ischial tuberosity.  No injury tendon tear.   Patient name: Lisa Ayers Patient MRN: [id number] Date: 11/14/22

## 2022-11-22 ENCOUNTER — Encounter: Payer: Self-pay | Admitting: Orthopedic Surgery

## 2022-11-22 ENCOUNTER — Ambulatory Visit: Payer: 59 | Admitting: Orthopedic Surgery

## 2022-11-22 DIAGNOSIS — M722 Plantar fascial fibromatosis: Secondary | ICD-10-CM | POA: Diagnosis not present

## 2022-11-22 MED ORDER — LIDOCAINE HCL 1 % IJ SOLN
2.0000 mL | INTRAMUSCULAR | Status: AC | PRN
Start: 2022-11-22 — End: 2022-11-22
  Administered 2022-11-22: 2 mL

## 2022-11-22 MED ORDER — METHYLPREDNISOLONE ACETATE 40 MG/ML IJ SUSP
40.0000 mg | INTRAMUSCULAR | Status: AC | PRN
Start: 2022-11-22 — End: 2022-11-22
  Administered 2022-11-22: 40 mg

## 2022-11-22 NOTE — Progress Notes (Signed)
Office Visit Note   Patient: Lisa Ayers           Date of Birth: 1980-12-28           MRN: [id number] Visit Date: 11/22/2022              Requested by: Jac Canavan, PA-C [id number] [place],  [place] [address] PCP: Jac Canavan, PA-C  Chief Complaint  Patient presents with   Left Foot - Pain     For plantar fascia pain on the left.  She states she has had symptoms for 9 months.  She has had shockwave therapy without relief.  She has night splints and stretching without relief. HPI: Patient is a 42 year old woman who is seen for initial evaluation  Assessment & Plan: Visit Diagnoses:  1. Plantar fasciitis, left     Plan: The origin of the plantar fascia was injected.  She will follow-up as needed.  Follow-Up Instructions: No follow-ups on file.   Ortho Exam  Patient is alert, oriented, no adenopathy, well-dressed, normal affect, normal respiratory effort. Examination patient has good pulses she has good ankle good subtalar motion.  She has a negative straight leg raise.  She has dorsiflexion of 20 degrees past neutral.  She has no tenderness to palpation over the medial calcaneal nerve.  Lateral compression of the calcaneus is nontender.  She has point tenderness to palpation over the medial origin of the plantar fascia.  Imaging: No results found. No images are attached to the encounter.  Labs: Lab Results  Component Value Date   ESRSEDRATE 2 09/30/2020   LABURIC 4.3 09/30/2020     Lab Results  Component Value Date   ALBUMIN 4.5 10/14/2019   ALBUMIN 4.7 02/05/2018   ALBUMIN 4.1 01/19/2015    No results found for: "MG" Lab Results  Component Value Date   VD25OH 47.9 10/14/2019   VD25OH 34.5 06/13/2018   VD25OH 38 01/19/2015    No results found for: "PREALBUMIN"    Latest Ref Rng & Units 10/14/2019    3:31 PM 02/05/2018    4:48 PM 02/15/2017    3:16 PM  CBC EXTENDED  WBC 3.4 - 10.8 x10E3/uL 5.9  6.2  6.7   RBC 3.77 - 5.28 x10E6/uL  4.77  4.45  4.83   Hemoglobin 11.1 - 15.9 g/dL 56.2  13.0  86.5   HCT 34.0 - 46.6 % 39.7  36.7  38.5   Platelets 150 - 450 x10E3/uL 296  268  277   NEUT# 1.4 - 7.0 x10E3/uL 3.5  3.6  3,873   Lymph# 0.7 - 3.1 x10E3/uL 2.0  2.0  2,419      There is no height or weight on file to calculate BMI.  Orders:  No orders of the defined types were placed in this encounter.  No orders of the defined types were placed in this encounter.    Procedures: Foot Inj  Date/Time: 11/22/2022 4:36 PM  Performed by: Nadara Mustard, MD Authorized by: Nadara Mustard, MD   Consent Given by:  Patient Site marked: the procedure site was marked   Timeout: prior to procedure the correct patient, procedure, and site was verified   Indications:  Fasciitis and pain Condition: Plantar Fasciitis   Location: left plantar fascia muscle   Prep: patient was prepped and draped in usual sterile fashion   Needle Size:  22 G Approach:  Plantar Medications:  2 mL lidocaine 1 %; 40 mg methylPREDNISolone acetate  40 MG/ML Patient Tolerance:  Patient tolerated the procedure well with no immediate complications   Clinical Data: No additional findings.  ROS:  All other systems negative, except as noted in the HPI. Review of Systems  Objective: Vital Signs: There were no vitals taken for this visit.  Specialty Comments:  No specialty comments available.  PMFS History: Patient Active Problem List   Diagnosis Date Noted   Pain in joint involving left pelvic region and thigh 09/14/2022   Plantar fasciitis of left foot 09/14/2022   Pain in finger of both hands 09/30/2020   Bone spur 09/30/2020   Decreased range of motion of fingers of both hands 09/30/2020   Abdominal pain 10/14/2019   Skin irritation 01/23/2019   Family history of breast cancer 01/23/2019   Alkaline phosphatase elevation 06/13/2018   Vitamin D deficiency 06/13/2018   Chronic back pain 06/13/2018   Need for Td vaccine 06/13/2018   Right hip  pain 01/24/2018   History of anemia 02/15/2017   Encounter for screening mammogram for malignant neoplasm of breast 01/19/2015   Gastroesophageal reflux disease without esophagitis 01/19/2015   Rhinitis, allergic 01/19/2015   Dermatitis 04/25/2011   Past Medical History:  Diagnosis Date   Allergy    Bilateral bunions    Bulging lumbar disc    L5 S1   Carpal tunnel syndrome    Dysplasia of cervix, low grade (CIN 1)    03/2016   Eczema    GERD (gastroesophageal reflux disease)    Somatic dysfunction of spine, thoracic    Thoracic back pain    Urinary tract infection    several prior   Wears contact lenses     Family History  Problem Relation Age of Onset   Depression Mother    Diabetes Mother    Thyroid disease Mother    Glaucoma Mother    Peripheral vascular disease Mother    Diabetes Father    Diabetes Maternal Grandmother    Stroke Maternal Grandmother    Heart disease Maternal Grandmother        CABG   Breast cancer Paternal Grandmother 1           COPD Paternal Grandmother    Cancer Cousin 72       breast, estrogen+, BRCA+   Cancer Cousin        breast   Colon cancer Neg Hx    Esophageal cancer Neg Hx     Past Surgical History:  Procedure Laterality Date   COLPOSCOPY     INTRAUTERINE DEVICE INSERTION  12/20/10   Paraguard   Social History   Occupational History   Occupation: Doctor, general practice    Employer: [employer]  Tobacco Use   Smoking status: Never   Smokeless tobacco: Never  Vaping Use   Vaping Use: Never used  Substance and Sexual Activity   Alcohol use: Yes    Comment: less than 3   Drug use: No   Sexual activity: Yes    Partners: Male    Birth control/protection: Surgical    Comment: Vasectomy

## 2023-02-06 ENCOUNTER — Ambulatory Visit: Payer: 59 | Admitting: Family Medicine

## 2023-02-06 ENCOUNTER — Encounter: Payer: Self-pay | Admitting: Family Medicine

## 2023-02-06 VITALS — BP 116/72 | Ht 67.0 in | Wt 170.0 lb

## 2023-02-06 DIAGNOSIS — M25552 Pain in left hip: Secondary | ICD-10-CM | POA: Diagnosis not present

## 2023-02-06 MED ORDER — MELOXICAM 7.5 MG PO TABS: 15.0000 mg | ORAL_TABLET | Freq: Once | ORAL | Status: DC

## 2023-02-06 NOTE — Assessment & Plan Note (Addendum)
-   Lisa Ayers's left posterior lateral hip pain has acutely worsened in the last week.  History and exam findings suspicious for greater trochanteric pain versus ischial bursitis versus worsening of proximal hamstring edema.  There is some IT band tightness which likely adds to the constellation of symptoms.  She has not had any neurologic symptoms or red flags. -MRI on 10/11/2022 showed edema along the left proximal hamstring attachment site, potentially reflecting low-grade stress reaction. No hamstring tendon tear. DDD at L4-5 and L5-S1, with suspected left lateral recess disc protrusion at L5-S1. Minimal spurring of both femoral heads and at the pubis.  - We will start with daily meloxicam, direct massage.  We also discussed some stretching of her IT band at home. -Unfortunately she does not have enough time today.  We will have her return ASAP for diagnostic US and consider injection therapy if indicated.

## 2023-02-06 NOTE — Progress Notes (Signed)
Lisa Ayers - 42 y.o. female MRN [id number]  Date of birth: Apr 23, 1981  PCP: Jac Canavan, PA-C  Subjective:  No chief complaint on file.  Posterior and lateral L hip pain  HPI: Past Medical, Surgical, Social, and Family History Reviewed & Updated per EMR.   Patient is a 42 y.o. female here for follow up on L hip pain, last seen on 5/16 and had an MRI of the pelvis performed.  Within the last week she has developed worsening pain of the posterior lateral thigh that interferes with sitting and lying on that side.  Of note she has had increased amount of sitting time while driving which is exacerbated the symptoms.  She has not had any distal numbness or weakness, fever, chills, falls or trauma.  The patient has tried ice intermittently and NSAIDs which have helped some but these have not been consistent therapies.   Past Medical History:  Diagnosis Date   Allergy    Bilateral bunions    Bulging lumbar disc    L5 S1   Carpal tunnel syndrome    Dysplasia of cervix, low grade (CIN 1)    03/2016   Eczema    GERD (gastroesophageal reflux disease)    Somatic dysfunction of spine, thoracic    Thoracic back pain    Urinary tract infection    several prior   Wears contact lenses     Current Outpatient Medications on File Prior to Visit  Medication Sig Dispense Refill   FAMOTIDINE PO Take by mouth.     gabapentin (NEURONTIN) 100 MG capsule Take by mouth one at bedtime for 5 days then increase to 2 caps for 5 days and then increase to 3 caps at night (Patient not taking: Reported on 10/11/2022) 90 capsule 1   nitroGLYCERIN (NITRODUR - DOSED IN MG/24 HR) 0.2 mg/hr patch Cut patch into 1/4 pieces Place a one fourth piece of patch on  skin over affected area, changing to a new piece every 24 hours. 30 patch 1   No current facility-administered medications on file prior to visit.    Past Surgical History:  Procedure Laterality Date   COLPOSCOPY     INTRAUTERINE DEVICE INSERTION   12/20/10   Paraguard    Allergies  Allergen Reactions   Augmentin [Amoxicillin-Pot Clavulanate]     Upset stomach   Elemental Sulfur Swelling    Of tongue.        Objective:  Physical Exam: VS: BP:116/72  HR: bpm  TEMP: ( )  RESP:   HT:5\' 7"  (170.2 cm)   WT:170 lb (77.1 kg)  BMI:26.62  Gen: NAD, speaks clearly, comfortable in exam room Respiratory: Normal respiratory effort on room air. No signs of distress Skin: No rashes, abrasions, or ecchymosis MSK: Normal gait Trendelenburg positive on the left The left hip has full range of motion in flexion, extension, IR, ER, and abduction.  Mild tenderness elicited with full flexion and resisted abduction. Strength 5/5 in all directions except for 3/5 in abduction. Logroll negative No tenderness to palpation over the anterior hip joint.  There is tenderness to palpation over the posterior greater trochanter and the medial proximal thigh consistent with the ischium/piriformis. Knee range of motion full without tenderness or crepitus.  5/5 strength No sensory deficits distally    Assessment & Plan:   Pain in joint involving left pelvic region and thigh - Reathel's left posterior lateral hip pain has acutely worsened in the last week.  History and exam  findings suspicious for greater trochanteric pain versus ischial bursitis versus worsening of proximal hamstring edema.  There is some IT band tightness which likely adds to the constellation of symptoms.  She has not had any neurologic symptoms or red flags. -MRI on 10/11/2022 showed edema along the left proximal hamstring attachment site, potentially reflecting low-grade stress reaction. No hamstring tendon tear. DDD at L4-5 and L5-S1, with suspected left lateral recess disc protrusion at L5-S1. Minimal spurring of both femoral heads and at the pubis.  - We will start with daily meloxicam, direct massage.  We also discussed some stretching of her IT band at home. -Unfortunately she does  not have enough time today.  We will have her return ASAP for diagnostic US and consider injection therapy if indicated.   Rica Mote MD [facility] Sports Medicine Fellow  Addendum:  Patient seen in the office by fellow.  His history, exam, plan of care were precepted with me.  Norton Blizzard MD Marrianne Mood

## 2023-02-08 ENCOUNTER — Other Ambulatory Visit: Payer: 59 | Admitting: Family Medicine

## 2023-02-08 ENCOUNTER — Encounter: Payer: Self-pay | Admitting: Family Medicine

## 2023-02-20 ENCOUNTER — Encounter: Payer: Self-pay | Admitting: Family Medicine

## 2023-02-20 ENCOUNTER — Ambulatory Visit: Payer: 59 | Admitting: Family Medicine

## 2023-02-20 ENCOUNTER — Other Ambulatory Visit: Payer: Self-pay

## 2023-02-20 VITALS — BP 110/82 | Ht 67.0 in | Wt 170.0 lb

## 2023-02-20 DIAGNOSIS — M25552 Pain in left hip: Secondary | ICD-10-CM

## 2023-02-20 DIAGNOSIS — M25551 Pain in right hip: Secondary | ICD-10-CM

## 2023-02-20 MED ORDER — METHYLPREDNISOLONE ACETATE 40 MG/ML IJ SUSP
40.0000 mg | Freq: Once | INTRAMUSCULAR | Status: AC
  Administered 2023-02-20: 40 mg via INTRA_ARTICULAR

## 2023-02-20 NOTE — Assessment & Plan Note (Addendum)
-   Injection as below.  Hip abduction series given for home exercise.  We will follow-up as needed.  Injection of Greater Trochanteric Bursa Indications: Pain  Procedure Details Verbal and written consent obtained. Risks, benefits, and alternatives reviewed. Greater trochanter sterilely prepped with Chloraprep. Ethyl Chloride used for anesthesia. 6 cc of Lidocaine 1% injected with 1 mL of methylprednisone 40 mg into trochanteric bursa at area of maximal tenderness at greater trochanter. Needle taken to bone to troch bursa, flows easily. Bursa massaged. No bleeding and no complications. Decreased pain after injection. Needle: 22 gauge spinal needle

## 2023-02-20 NOTE — Progress Notes (Unsigned)
Lisa Ayers - 42 y.o. female MRN [id number]  Date of birth: May 01, 1981  PCP: Jac Canavan, PA-C  Subjective:  No chief complaint on file. Left lateral hip pain.  HPI: Past Medical, Surgical, Social, and Family History Reviewed & Updated per EMR.   Patient is a 42 y.o. female here for follow up on left lateral hip pain. The patient has tried some self-directed home exercises with straight leg raises which has given some minimal relief but her pain is still substantial.  Past Medical History:  Diagnosis Date   Allergy    Bilateral bunions    Bulging lumbar disc    L5 S1   Carpal tunnel syndrome    Dysplasia of cervix, low grade (CIN 1)    03/2016   Eczema    GERD (gastroesophageal reflux disease)    Somatic dysfunction of spine, thoracic    Thoracic back pain    Urinary tract infection    several prior   Wears contact lenses     Current Outpatient Medications on File Prior to Visit  Medication Sig Dispense Refill   FAMOTIDINE PO Take by mouth.     gabapentin (NEURONTIN) 100 MG capsule Take by mouth one at bedtime for 5 days then increase to 2 caps for 5 days and then increase to 3 caps at night (Patient not taking: Reported on 10/11/2022) 90 capsule 1   nitroGLYCERIN (NITRODUR - DOSED IN MG/24 HR) 0.2 mg/hr patch Cut patch into 1/4 pieces Place a one fourth piece of patch on  skin over affected area, changing to a new piece every 24 hours. 30 patch 1   Current Facility-Administered Medications on File Prior to Visit  Medication Dose Route Frequency Provider Last Rate Last Admin   meloxicam (MOBIC) tablet 15 mg  15 mg Oral Once         Past Surgical History:  Procedure Laterality Date   COLPOSCOPY     INTRAUTERINE DEVICE INSERTION  12/20/10   Paraguard    Allergies  Allergen Reactions   Augmentin [Amoxicillin-Pot Clavulanate]     Upset stomach   Elemental Sulfur Swelling    Of tongue.        Objective:  Physical Exam: VS: BP:110/82  HR: bpm  TEMP: ( )   RESP:   HT:5\' 7"  (170.2 cm)   WT:170 lb (77.1 kg)  BMI:26.62  Gen: NAD, speaks clearly, comfortable in exam room Respiratory: Normal respiratory effort on room air. No signs of distress Skin: No rashes, abrasions, or ecchymosis MSK: Normal gait, no edema/erythema/ecchymosis on inspection.  ROM full Strength 4/5 in hip abduction.  TTP over the L posterior greater trochanter  Mild tenderness over the proximal tensor fascia lata muscles anteriorly NVID  Limited Ultrasound of the L lateral hip Hyperechoic changes of the gluteus medius and minimus insertion tendons at the insertion on the greater trochanter No hypoechoic fluid or intratendinous lines to indicate bursitis or tears  Summary: greater trochanteric insertional tendonitis of hip abductors  Ultrasound and interpretation by Dr. Webb Silversmith and Dr. Pearletha Forge     Assessment & Plan:   Greater trochanteric pain syndrome of left lower extremity - Injection as below.  Hip abduction series given for home exercise.  We will follow-up as needed.  Injection of Greater Trochanteric Bursa Indications: Pain  Procedure Details Verbal and written consent obtained. Timeout was performed.  Risks, benefits, and alternatives reviewed. Greater trochanter sterilely prepped with Chloraprep. Ethyl Chloride used for anesthesia. 6 cc of Lidocaine 1% injected  with 1 mL of methylprednisone 40 mg into trochanteric bursa at area of maximal tenderness at greater trochanter. Needle taken to bone to troch bursa, flows easily. Bursa massaged. No bleeding and no complications. Decreased pain after injection. Needle: 22 gauge spinal needle     Rica Mote MD [facility] Sports Medicine Fellow  Addendum:  Patient seen in the office by fellow.  His history, exam, plan of care were precepted with me.  Norton Blizzard MD Marrianne Mood

## 2023-04-12 DIAGNOSIS — Z1231 Encounter for screening mammogram for malignant neoplasm of breast: Secondary | ICD-10-CM | POA: Diagnosis not present

## 2023-04-12 LAB — HM MAMMOGRAPHY

## 2023-04-19 ENCOUNTER — Encounter: Payer: Self-pay | Admitting: Internal Medicine

## 2023-05-15 ENCOUNTER — Ambulatory Visit (INDEPENDENT_AMBULATORY_CARE_PROVIDER_SITE_OTHER): Payer: 59 | Admitting: Obstetrics and Gynecology

## 2023-05-15 ENCOUNTER — Other Ambulatory Visit: Payer: Self-pay

## 2023-05-15 ENCOUNTER — Other Ambulatory Visit (HOSPITAL_COMMUNITY): Payer: Self-pay

## 2023-05-15 ENCOUNTER — Encounter: Payer: Self-pay | Admitting: Obstetrics and Gynecology

## 2023-05-15 ENCOUNTER — Other Ambulatory Visit (HOSPITAL_COMMUNITY)
Admission: RE | Admit: 2023-05-15 | Discharge: 2023-05-15 | Disposition: A | Discharge status: Home / Self Care | Payer: 59 | Source: Ambulatory Visit | Attending: Obstetrics and Gynecology | Admitting: Obstetrics and Gynecology

## 2023-05-15 VITALS — BP 110/68 | HR 87 | Ht 66.25 in | Wt 173.0 lb

## 2023-05-15 DIAGNOSIS — Z01419 Encounter for gynecological examination (general) (routine) without abnormal findings: Secondary | ICD-10-CM

## 2023-05-15 DIAGNOSIS — N951 Menopausal and female climacteric states: Secondary | ICD-10-CM | POA: Diagnosis not present

## 2023-05-15 DIAGNOSIS — Z1331 Encounter for screening for depression: Secondary | ICD-10-CM | POA: Diagnosis not present

## 2023-05-15 MED ORDER — MELOXICAM 15 MG PO TABS
15.0000 mg | ORAL_TABLET | Freq: Every day | ORAL | 1 refills | Status: AC
  Filled 2023-05-15: qty 30, 30d supply, fill #0

## 2023-05-15 MED ORDER — NORETHIN-ETH ESTRAD-FE BIPHAS 1 MG-10 MCG / 10 MCG PO TABS
1.0000 | ORAL_TABLET | Freq: Every day | ORAL | 11 refills | Status: AC
  Filled 2023-05-15: qty 28, 24d supply, fill #0
  Filled 2023-06-04: qty 28, 24d supply, fill #1

## 2023-05-15 NOTE — Progress Notes (Signed)
42 y.o. y.o. female here for annual exam. No LMP recorded. (Menstrual status: Irregular Periods). Period Pattern: (!) Irregular Can go a few months without a cycle.  Last cycle was in October. 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     05/15/2023    8:09 AM 01/18/2022    8:37 AM 10/14/2019    2:57 PM  Depression screen PHQ 2/9  Decreased Interest 0 0 0  Down, Depressed, Hopeless 0 0 0  PHQ - 2 Score 0 0 0    Blood pressure 110/68, pulse 87, height 5' 6.25" (1.683 m), weight 173 lb (78.5 kg), SpO2 96%.     Component Value Date/Time   DIAGPAP  07/22/2020 1656    - Negative for intraepithelial lesion or malignancy (NILM)   DIAGPAP  03/13/2018 0000    NEGATIVE FOR INTRAEPITHELIAL LESIONS OR MALIGNANCY.   DIAGPAP  02/14/2017 0000    NEGATIVE FOR INTRAEPITHELIAL LESIONS OR MALIGNANCY.   HPVHIGH Negative 07/22/2020 1656   ADEQPAP  07/22/2020 1656    Satisfactory for evaluation; transformation zone component PRESENT.   ADEQPAP  03/13/2018 0000    Satisfactory for evaluation  endocervical/transformation zone component PRESENT.   ADEQPAP  02/14/2017 0000    Satisfactory for evaluation  endocervical/transformation zone component PRESENT.    GYN HISTORY:    Component Value Date/Time   DIAGPAP  07/22/2020 1656    - Negative for intraepithelial lesion or malignancy (NILM)   DIAGPAP  03/13/2018 0000    NEGATIVE FOR INTRAEPITHELIAL LESIONS OR MALIGNANCY.   DIAGPAP  02/14/2017 0000    NEGATIVE FOR INTRAEPITHELIAL LESIONS OR MALIGNANCY.   HPVHIGH Negative 07/22/2020 1656   ADEQPAP  07/22/2020 1656     Satisfactory for evaluation; transformation zone component PRESENT.   ADEQPAP  03/13/2018 0000    Satisfactory for evaluation  endocervical/transformation zone component PRESENT.   ADEQPAP  02/14/2017 0000    Satisfactory for evaluation  endocervical/transformation zone component PRESENT.    OB History  Gravida Para Term Preterm AB Living  1 1 1  0 0 1  SAB IAB Ectopic Multiple Live Births  0 0 0 0 1    # Outcome Date GA Lbr Len/2nd Weight Sex Type Anes PTL Lv  1 Term 2012    F Vag-Spont   LIV    Past Medical History:  Diagnosis Date   Allergy    Bilateral bunions    Bulging lumbar disc    L5 S1   Carpal tunnel syndrome    Dysplasia of cervix, low grade (CIN 1)    03/2016   Eczema    GERD (gastroesophageal reflux disease)    Somatic dysfunction of spine, thoracic    Thoracic back pain    Urinary tract infection    several prior   Wears contact lenses     Past Surgical History:  Procedure Laterality Date   COLPOSCOPY     INTRAUTERINE DEVICE INSERTION  12/20/10   Paraguard    Current Outpatient Medications on File Prior to Visit  Medication Sig Dispense Refill   FAMOTIDINE PO Take by mouth.  IBUPROFEN PO Take by mouth.     MAGNESIUM PO Take by mouth.     VITAMIN D PO Take by mouth.     Current Facility-Administered Medications on File Prior to Visit  Medication Dose Route Frequency Provider Last Rate Last Admin   meloxicam (MOBIC) tablet 15 mg  15 mg Oral Once         Social History   Socioeconomic History   Marital status: Divorced    Spouse name: Not on file   Number of children: 1   Years of education: Not on file   Highest education level: Not on file  Occupational History   Occupation: Doctor, general practice    Employer: [employer]  Tobacco Use   Smoking status: Never   Smokeless tobacco: Never  Vaping Use   Vaping status: Never Used  Substance and Sexual Activity   Alcohol use: Yes    Comment: less than 3   Drug use: No   Sexual activity:  Yes    Partners: Male    Birth control/protection: Other-see comments    Comment: partner Vasectomy  Other Topics Concern   Not on file  Social History Narrative   Lives with her husband and her daughter [name], 1 dog.  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  Agnostic.  09/2019   Social Drivers of Corporate investment banker Strain: Not on file  Food Insecurity: Not on file  Transportation Needs: Not on file  Physical Activity: Not on file  Stress: Not on file  Social Connections: Not on file  Intimate Partner Violence: Not on file    Family History  Problem Relation Age of Onset   Stroke Mother    Depression Mother    Diabetes Mother    Thyroid disease Mother    Peripheral vascular disease Mother    Other Mother        diabetic retinopathy   Diabetes Father    Diabetes Maternal Grandmother    Stroke Maternal Grandmother    Heart disease Maternal Grandmother        CABG   Breast cancer Paternal Grandmother 101           COPD Paternal Grandmother    Cancer Cousin 42       breast, estrogen+, BRCA+   Cancer Cousin        breast     Allergies  Allergen Reactions   Augmentin [Amoxicillin-Pot Clavulanate]     Upset stomach   Elemental Sulfur Swelling    Of tongue.      Patient's last menstrual period was No LMP recorded. (Menstrual status: Irregular Periods)..             Review of Systems Alls systems reviewed and are negative.     Physical Exam Constitutional:      Appearance: Normal appearance.  Genitourinary:     Vulva and urethral meatus normal.     No lesions in the vagina.     Genitourinary Comments: Possible yeast infection     Right Labia: No rash, lesions or skin changes.    Left Labia: No lesions, skin changes or rash.    Vaginal discharge present.     No vaginal tenderness.     No vaginal prolapse present.    No vaginal atrophy present.     Right Adnexa: not tender, not palpable and no mass present.    Left Adnexa:  not tender, not palpable and no mass present.  No cervical motion tenderness or discharge.     Uterus is not enlarged, tender or irregular.  Breasts:    Right: Normal.     Left: Normal.  HENT:     Head: Normocephalic.  Neck:     Thyroid: No thyroid mass, thyromegaly or thyroid tenderness.  Cardiovascular:     Rate and Rhythm: Normal rate and regular rhythm.     Heart sounds: Normal heart sounds, S1 normal and S2 normal.  Pulmonary:     Effort: Pulmonary effort is normal.     Breath sounds: Normal breath sounds and air entry.  Abdominal:     General: There is no distension.     Palpations: Abdomen is soft. There is no mass.     Tenderness: There is no abdominal tenderness. There is no guarding or rebound.  Musculoskeletal:        General: Normal range of motion.     Cervical back: Full passive range of motion without pain, normal range of motion and neck supple. No tenderness.     Right lower leg: No edema.     Left lower leg: No edema.  Neurological:     Mental Status: She is alert.  Skin:    General: Skin is warm.  Psychiatric:        Mood and Affect: Mood normal.        Behavior: Behavior normal.        Thought Content: Thought content normal.  Vitals and nursing note reviewed. Exam conducted with a chaperone present.       A:         Well Woman GYN exam                             P:        Pap smear collected today Encouraged annual mammogram screening Colon cancer screening not indicated DXA not indicated Labs and immunizations ordered today To get HRT panel today.  To begin low dose ocp's.  Counseled on on ocps and HRT  Encouraged weight loss consult with Jami in the office Encouraged healthy lifestyle practices Encouraged Vit D and Calcium   No follow-ups on file.  Earley Favor

## 2023-05-15 NOTE — Progress Notes (Signed)
Pt called requesting refill on Meloxicam.

## 2023-05-16 LAB — SURESWAB® ADVANCED VAGINITIS PLUS,TMA
C. trachomatis RNA, TMA: NOT DETECTED
CANDIDA SPECIES: NOT DETECTED
Candida glabrata: NOT DETECTED
N. gonorrhoeae RNA, TMA: NOT DETECTED
SURESWAB(R) ADV BACTERIAL VAGINOSIS(BV),TMA: NEGATIVE
TRICHOMONAS VAGINALIS (TV),TMA: NOT DETECTED

## 2023-05-17 LAB — CYTOLOGY - PAP
Comment: NEGATIVE
Diagnosis: UNDETERMINED — AB
High risk HPV: NEGATIVE

## 2023-05-21 LAB — COMPREHENSIVE METABOLIC PANEL
AG Ratio: 1.6 (calc) (ref 1.0–2.5)
ALT: 17 U/L (ref 6–29)
AST: 22 U/L (ref 10–30)
Albumin: 4.3 g/dL (ref 3.6–5.1)
Alkaline phosphatase (APISO): 148 U/L — ABNORMAL HIGH (ref 31–125)
BUN: 16 mg/dL (ref 7–25)
CO2: 25 mmol/L (ref 20–32)
Calcium: 9.8 mg/dL (ref 8.6–10.2)
Chloride: 105 mmol/L (ref 98–110)
Creat: 0.69 mg/dL (ref 0.50–0.99)
Globulin: 2.7 g/dL (ref 1.9–3.7)
Glucose, Bld: 70 mg/dL (ref 65–99)
Potassium: 4.4 mmol/L (ref 3.5–5.3)
Sodium: 137 mmol/L (ref 135–146)
Total Bilirubin: 0.5 mg/dL (ref 0.2–1.2)
Total Protein: 7 g/dL (ref 6.1–8.1)

## 2023-05-21 LAB — LIPID PANEL
Cholesterol: 234 mg/dL — ABNORMAL HIGH (ref <200)
HDL: 93 mg/dL (ref >=50)
LDL Cholesterol (Calc): 124 mg/dL — ABNORMAL HIGH
Non-HDL Cholesterol (Calc): 141 mg/dL — ABNORMAL HIGH (ref <130)
Total CHOL/HDL Ratio: 2.5 (calc) (ref <5.0)
Triglycerides: 75 mg/dL (ref <150)

## 2023-05-21 LAB — HEMOGLOBIN A1C
Hgb A1c MFr Bld: 5.3 %{Hb} (ref <5.7)
Mean Plasma Glucose: 105 mg/dL
eAG (mmol/L): 5.8 mmol/L

## 2023-05-21 LAB — TSH: TSH: 2.82 m[IU]/L

## 2023-05-21 LAB — CBC
HCT: 41.1 % (ref 35.0–45.0)
Hemoglobin: 13.2 g/dL (ref 11.7–15.5)
MCH: 26.5 pg — ABNORMAL LOW (ref 27.0–33.0)
MCHC: 32.1 g/dL (ref 32.0–36.0)
MCV: 82.5 fL (ref 80.0–100.0)
MPV: 9.4 fL (ref 7.5–12.5)
Platelets: 273 10*3/uL (ref 140–400)
RBC: 4.98 10*6/uL (ref 3.80–5.10)
RDW: 11.8 % (ref 11.0–15.0)
WBC: 4.7 10*3/uL (ref 3.8–10.8)

## 2023-05-21 LAB — TESTOS,TOTAL,FREE AND SHBG (FEMALE)
Free Testosterone: 1.2 pg/mL (ref 0.1–6.4)
Sex Hormone Binding: 79.1 nmol/L (ref 17–124)
Testosterone, Total, LC-MS-MS: 13 ng/dL (ref 2–45)

## 2023-05-21 LAB — ESTRADIOL: Estradiol: 42 pg/mL

## 2023-05-21 LAB — VITAMIN D 25 HYDROXY (VIT D DEFICIENCY, FRACTURES): Vit D, 25-Hydroxy: 33 ng/mL (ref 30–100)

## 2023-05-21 LAB — FOLLICLE STIMULATING HORMONE: FSH: 17 m[IU]/mL

## 2023-06-07 ENCOUNTER — Other Ambulatory Visit: Payer: Self-pay

## 2023-07-27 ENCOUNTER — Telehealth: Admitting: Family Medicine

## 2023-07-27 DIAGNOSIS — J101 Influenza due to other identified influenza virus with other respiratory manifestations: Secondary | ICD-10-CM | POA: Diagnosis not present

## 2023-07-27 MED ORDER — OSELTAMIVIR PHOSPHATE 75 MG PO CAPS
75.0000 mg | ORAL_CAPSULE | Freq: Two times a day (BID) | ORAL | 0 refills | Status: AC
Start: 2023-07-27 — End: 2023-08-01

## 2023-07-27 MED ORDER — FLUTICASONE PROPIONATE 50 MCG/ACT NA SUSP
2.0000 | Freq: Every day | NASAL | 0 refills | Status: AC
Start: 2023-07-27 — End: ?

## 2023-07-27 NOTE — Patient Instructions (Signed)
 Yehuda Savannah Peugh, thank you for joining Reed Pandy, PA-C for today's virtual visit.  While this provider is not your primary care provider (PCP), if your PCP is located in our provider database this encounter information will be shared with them immediately following your visit.   A [facility] MyChart account gives you access to today's visit and all your visits, tests, and labs performed at [facility] " click here if you don't have a [facility] MyChart account or go to [email or web address]  Consent: (Patient) Shamela Haydon Serratore provided verbal consent for this virtual visit at the beginning of the encounter.  Current Medications:  Current Outpatient Medications:    fluticasone (FLONASE) 50 MCG/ACT nasal spray, Place 2 sprays into both nostrils daily., Disp: 16 g, Rfl: 0   oseltamivir (TAMIFLU) 75 MG capsule, Take 1 capsule (75 mg total) by mouth 2 (two) times daily for 5 days., Disp: 10 capsule, Rfl: 0   FAMOTIDINE PO, Take by mouth., Disp: , Rfl:    IBUPROFEN PO, Take by mouth., Disp: , Rfl:    MAGNESIUM PO, Take by mouth., Disp: , Rfl:    meloxicam (MOBIC) 15 MG tablet, Take 1 tablet (15 mg total) by mouth daily., Disp: 30 tablet, Rfl: 1   Norethindrone-Ethinyl Estradiol-Fe Biphas (LO LOESTRIN FE) 1 MG-10 MCG / 10 MCG tablet, Take 1 tablet by mouth daily. Take on continuous basis. Skip placebo pills and start a new pack, Disp: 28 tablet, Rfl: 11   VITAMIN D PO, Take by mouth., Disp: , Rfl:    Medications ordered in this encounter:  Meds ordered this encounter  Medications   oseltamivir (TAMIFLU) 75 MG capsule    Sig: Take 1 capsule (75 mg total) by mouth 2 (two) times daily for 5 days.    Dispense:  10 capsule    Refill:  0   fluticasone (FLONASE) 50 MCG/ACT nasal spray    Sig: Place 2 sprays into both nostrils daily.    Dispense:  16 g    Refill:  0     *If you need refills on other medications prior to your next appointment, please contact your  pharmacy*  Follow-Up: Call back or seek an in-person evaluation if the symptoms worsen or if the condition fails to improve as anticipated.  [facility] [phone]  Other Instructions Influenza, Adult Influenza is also called the flu. It's an infection that affects your respiratory tract. This includes your nose, throat, windpipe, and lungs. The flu is contagious. This means it spreads easily from person to person. It causes symptoms that are like a cold. It can also cause a high fever and body aches. What are the causes? The flu is caused by the influenza virus. You can get it by: Breathing in droplets that are in the air after an infected person coughs or sneezes. Touching something that has the virus on it and then touching your mouth, nose, or eyes. What increases the risk? You may be more likely to get the flu if: You don't wash your hands often. You're near a lot of people during cold and flu season. You touch your mouth, eyes, or nose without washing your hands first. You don't get a flu shot each year. You may also be more at risk for the flu and serious problems, such as a lung infection called pneumonia, if: You're older than 65. You're pregnant. Your immune system is weak. Your immune system is your body's defense system. You have a long-term,  or chronic, condition, such as: Heart, kidney, or lung disease. Diabetes. A liver disorder. Asthma. You're very overweight. You have anemia. This is when you don't have enough red blood cells in your body. What are the signs or symptoms? Flu symptoms often start all of a sudden. They may last 4-14 days and include: Fever and chills. Headaches, body aches, or muscle aches. Sore throat. Cough. Runny or stuffy nose. Discomfort in your chest. Not wanting to eat as much as normal. Feeling weak or tired. Feeling dizzy. Nausea or vomiting. How is this diagnosed? The flu may be diagnosed based on your symptoms  and medical history. You may also have a physical exam. A swab may be taken from your nose or throat and tested for the virus. How is this treated? If the flu is found early, you can be treated with antiviral medicine. This may be given to you by mouth or through an IV. It can help you feel less sick and get better faster. Taking care of yourself at home can also help your symptoms get better. Your health care provider may tell you to: Take over-the-counter medicines. Drink lots of fluids. The flu often goes away on its own. If you have very bad symptoms or problems caused by the flu, you may need to be treated in a hospital. Follow these instructions at home: Activity Rest as needed. Get lots of sleep. Stay home from work or school as told by your provider. Leave home only to go see your provider. Do not leave home for other reasons until you don't have a fever for 24 hours without taking medicine. Eating and drinking Take an oral rehydration solution (ORS). This is a drink that is sold at pharmacies and stores. Drink enough fluid to keep your pee pale yellow. Try to drink small amounts of clear fluids. These include water, ice chips, fruit juice mixed with water, and low-calorie sports drinks. Try to eat bland foods that are easy to digest. These include bananas, applesauce, rice, lean meats, toast, and crackers. Avoid drinks that have a lot of sugar or caffeine in them. These include energy drinks, regular sports drinks, and soda. Do not drink alcohol. Do not eat spicy or fatty foods. General instructions     Take your medicines only as told by your provider. Use a cool mist humidifier to add moisture to the air in your home. This can make it easier for you to breathe. You should also clean the humidifier every day. To do so: Empty the water. Pour clean water in. Cover your mouth and nose when you cough or sneeze. Wash your hands with soap and water often and for at least 20 seconds.  It's extra important to do so after you cough or sneeze. If you can't use soap and water, use hand sanitizer. How is this prevented?  Get a flu shot every year. Ask your provider when you should get your flu shot. Stay away from people who are sick during fall and winter. Fall and winter are cold and flu season. Contact a health care provider if: You get new symptoms. You have chest pain. You have watery poop, also called diarrhea. You have a fever. Your cough gets worse. You start to have more mucus. You feel like you may vomit, or you vomit. Get help right away if: You become short of breath or have trouble breathing. Your skin or nails turn blue. You have very bad pain or stiffness in your neck. You get  a sudden headache or pain in your face or ear. You vomit each time you eat or drink. These symptoms may be an emergency. Call 911 right away. Do not wait to see if the symptoms will go away. Do not drive yourself to the hospital. This information is not intended to replace advice given to you by your health care provider. Make sure you discuss any questions you have with your health care provider. Document Revised: 02/14/2023 Document Reviewed: 06/21/2022 Elsevier Patient Education  2024 Elsevier Inc.   If you have been instructed to have an in-person evaluation today at a local Urgent Care facility, please use the link below. It will take you to a list of all of our available [facility], including address, phone number and hours of operation. Please do not delay care.  [facility]  If you or a family member do not have a primary care provider, use the link below to schedule a visit and establish care. When you choose a [facility] primary care physician or advanced practice provider, you gain a long-term partner in health. Find a Primary Care Provider  Learn more about [facility]'s in-office and virtual care options: [facility] - Get Care Now

## 2023-07-27 NOTE — Progress Notes (Signed)
 Virtual Visit Consent   Lisa Ayers, you are scheduled for a virtual visit with a [facility] provider today. Just as with appointments in the office, your consent must be obtained to participate. Your consent will be active for this visit and any virtual visit you may have with one of our providers in the next 365 days. If you have a MyChart account, a copy of this consent can be sent to you electronically.  As this is a virtual visit, video technology does not allow for your provider to perform a traditional examination. This may limit your provider's ability to fully assess your condition. If your provider identifies any concerns that need to be evaluated in person or the need to arrange testing (such as labs, EKG, etc.), we will make arrangements to do so. Although advances in technology are sophisticated, we cannot ensure that it will always work on either your end or our end. If the connection with a video visit is poor, the visit may have to be switched to a telephone visit. With either a video or telephone visit, we are not always able to ensure that we have a secure connection.  By engaging in this virtual visit, you consent to the provision of healthcare and authorize for your insurance to be billed (if applicable) for the services provided during this visit. Depending on your insurance coverage, you may receive a charge related to this service.  I need to obtain your verbal consent now. Are you willing to proceed with your visit today? Lisa Ayers has provided verbal consent on 07/27/2023 for a virtual visit (video or telephone). Lisa Ayers, [place]  Date: 07/27/2023 3:02 PM   Virtual Visit via Video Note   I, Lisa Ayers, connected with  Lisa Ayers  ([id number], May 22, 1981) on 07/27/23 at  3:00 PM EST by a video-enabled telemedicine application and verified that I am speaking with the correct person using two identifiers.  Location: Patient: Virtual Visit Location Patient:  Home Provider: Virtual Visit Location Provider: Home Office   I discussed the limitations of evaluation and management by telemedicine and the availability of in person appointments. The patient expressed understanding and agreed to proceed.    History of Present Illness: Lisa Ayers is a 43 y.o. who identifies as a female who was assigned female at birth, and is being seen today for c/o positive Flu A test and shows the exam. Pt states symptoms started on Thursday and stayed home from work on Friday.  Pt states she has nasal congestion, runny nose, sore throat and feeling of inflammation, HA, fatigue and body aches and sweats and chills.  Taking acetaminophen with a 99 temp.    HPI: HPI  Problems:  Patient Active Problem List   Diagnosis Date Noted   Pain in joint involving left pelvic region and thigh 09/14/2022   Plantar fasciitis of left foot 09/14/2022   Pain in finger of both hands 09/30/2020   Bone spur 09/30/2020   Decreased range of motion of fingers of both hands 09/30/2020   Abdominal pain 10/14/2019   Skin irritation 01/23/2019   Family history of breast cancer 01/23/2019   Alkaline phosphatase elevation 06/13/2018   Vitamin D deficiency 06/13/2018   Chronic back pain 06/13/2018   Need for Td vaccine 06/13/2018   Greater trochanteric pain syndrome of left lower extremity 01/24/2018   History of anemia 02/15/2017   Encounter for screening mammogram for malignant neoplasm of breast 01/19/2015   Gastroesophageal reflux disease  without esophagitis 01/19/2015   Rhinitis, allergic 01/19/2015   Dermatitis 04/25/2011    Allergies:  Allergies  Allergen Reactions   Augmentin [Amoxicillin-Pot Clavulanate]     Upset stomach   Elemental Sulfur Swelling    Of tongue.   Medications:  Current Outpatient Medications:    fluticasone (FLONASE) 50 MCG/ACT nasal spray, Place 2 sprays into both nostrils daily., Disp: 16 g, Rfl: 0   oseltamivir (TAMIFLU) 75 MG capsule, Take 1  capsule (75 mg total) by mouth 2 (two) times daily for 5 days., Disp: 10 capsule, Rfl: 0   FAMOTIDINE PO, Take by mouth., Disp: , Rfl:    IBUPROFEN PO, Take by mouth., Disp: , Rfl:    MAGNESIUM PO, Take by mouth., Disp: , Rfl:    meloxicam (MOBIC) 15 MG tablet, Take 1 tablet (15 mg total) by mouth daily., Disp: 30 tablet, Rfl: 1   Norethindrone-Ethinyl Estradiol-Fe Biphas (LO LOESTRIN FE) 1 MG-10 MCG / 10 MCG tablet, Take 1 tablet by mouth daily. Take on continuous basis. Skip placebo pills and start a new pack, Disp: 28 tablet, Rfl: 11   VITAMIN D PO, Take by mouth., Disp: , Rfl:   Observations/Objective: Patient is well-developed, well-nourished in no acute distress.  Resting comfortably at home.  Head is normocephalic, atraumatic.  No labored breathing.  Speech is clear and coherent with logical content.  Patient is alert and oriented at baseline.    Assessment and Plan: 1. Influenza A (Primary) - oseltamivir (TAMIFLU) 75 MG capsule; Take 1 capsule (75 mg total) by mouth 2 (two) times daily for 5 days.  Dispense: 10 capsule; Refill: 0 - fluticasone (FLONASE) 50 MCG/ACT nasal spray; Place 2 sprays into both nostrils daily.  Dispense: 16 g; Refill: 0  -Start Tamiflu and Flonase -Pt to continue conservative measure -Advised pt to follow up with urgent care or PCP for worsening symptoms.    Follow Up Instructions: I discussed the assessment and treatment plan with the patient. The patient was provided an opportunity to ask questions and all were answered. The patient agreed with the plan and demonstrated an understanding of the instructions.  A copy of instructions were sent to the patient via MyChart unless otherwise noted below.    The patient was advised to call back or seek an in-person evaluation if the symptoms worsen or if the condition fails to improve as anticipated.    Lisa Pandy, PA-C

## 2023-07-29 ENCOUNTER — Telehealth: Admitting: Family Medicine

## 2023-07-29 ENCOUNTER — Other Ambulatory Visit (HOSPITAL_COMMUNITY): Payer: Self-pay

## 2023-07-29 DIAGNOSIS — R051 Acute cough: Secondary | ICD-10-CM | POA: Diagnosis not present

## 2023-07-29 DIAGNOSIS — J101 Influenza due to other identified influenza virus with other respiratory manifestations: Secondary | ICD-10-CM

## 2023-07-29 MED ORDER — BENZONATATE 100 MG PO CAPS
100.0000 mg | ORAL_CAPSULE | Freq: Three times a day (TID) | ORAL | 0 refills | Status: AC | PRN
Start: 2023-07-29 — End: ?
  Filled 2023-07-29: qty 30, 10d supply, fill #0

## 2023-07-29 MED ORDER — PROMETHAZINE-DM 6.25-15 MG/5ML PO SYRP
5.0000 mL | ORAL_SOLUTION | Freq: Four times a day (QID) | ORAL | 0 refills | Status: AC | PRN
Start: 2023-07-29 — End: ?
  Filled 2023-07-29: qty 118, 6d supply, fill #0

## 2023-07-29 NOTE — Progress Notes (Signed)
 E-Visit for Cough   We are sorry that you are not feeling well.  Here is how we plan to help!  Based on your presentation I believe you most likely have A cough due to a virus.  This is called viral bronchitis and is best treated by rest, plenty of fluids and control of the cough.  You may use Ibuprofen or Tylenol as directed to help your symptoms.     In addition you may use A prescription cough medication called Tessalon Perles 100mg . You may take 1-2 capsules every 8 hours as needed for your cough.  Along with Promethazine DM cough syrup use as directed.   From your responses in the eVisit questionnaire you describe inflammation in the upper respiratory tract which is causing a significant cough.  This is commonly called Bronchitis and has four common causes:   Allergies Viral Infections Acid Reflux Bacterial Infection Allergies, viruses and acid reflux are treated by controlling symptoms or eliminating the cause. An example might be a cough caused by taking certain blood pressure medications. You stop the cough by changing the medication. Another example might be a cough caused by acid reflux. Controlling the reflux helps control the cough.  USE OF BRONCHODILATOR ("RESCUE") INHALERS: There is a risk from using your bronchodilator too frequently.  The risk is that over-reliance on a medication which only relaxes the muscles surrounding the breathing tubes can reduce the effectiveness of medications prescribed to reduce swelling and congestion of the tubes themselves.  Although you feel brief relief from the bronchodilator inhaler, your asthma may actually be worsening with the tubes becoming more swollen and filled with mucus.  This can delay other crucial treatments, such as oral steroid medications. If you need to use a bronchodilator inhaler daily, several times per day, you should discuss this with your provider.  There are probably better treatments that could be used to keep your asthma  under control.     HOME CARE Only take medications as instructed by your medical team. Complete the entire course of an antibiotic. Drink plenty of fluids and get plenty of rest. Avoid close contacts especially the very young and the elderly Cover your mouth if you cough or cough into your sleeve. Always remember to wash your hands A steam or ultrasonic humidifier can help congestion.   GET HELP RIGHT AWAY IF: You develop worsening fever. You become short of breath You cough up blood. Your symptoms persist after you have completed your treatment plan MAKE SURE YOU  Understand these instructions. Will watch your condition. Will get help right away if you are not doing well or get worse.    Thank you for choosing an e-visit.  Your e-visit answers were reviewed by a board certified advanced clinical practitioner to complete your personal care plan. Depending upon the condition, your plan could have included both over the counter or prescription medications.  Please review your pharmacy choice. Make sure the pharmacy is open so you can pick up prescription now. If there is a problem, you may contact your provider through [employer] and have the prescription routed to another pharmacy.  Your safety is important to [place]. If you have drug allergies check your prescription carefully.   For the next 24 hours you can use MyChart to ask questions about today's visit, request a non-urgent call back, or ask for a work or school excuse. You will get an email in the next two days asking about your experience. I hope that  your e-visit has been valuable and will speed your recovery.  I provided 5 minutes of non face-to-face time during this encounter for chart review, medication and order placement, as well as and documentation.

## 2023-08-07 ENCOUNTER — Other Ambulatory Visit (HOSPITAL_COMMUNITY): Payer: Self-pay

## 2023-09-18 DIAGNOSIS — N951 Menopausal and female climacteric states: Secondary | ICD-10-CM | POA: Diagnosis not present

## 2024-03-25 ENCOUNTER — Other Ambulatory Visit (HOSPITAL_COMMUNITY): Payer: Self-pay

## 2024-03-25 DIAGNOSIS — Z01419 Encounter for gynecological examination (general) (routine) without abnormal findings: Secondary | ICD-10-CM | POA: Diagnosis not present

## 2024-03-25 DIAGNOSIS — Z124 Encounter for screening for malignant neoplasm of cervix: Secondary | ICD-10-CM | POA: Diagnosis not present

## 2024-03-25 DIAGNOSIS — Z6825 Body mass index (BMI) 25.0-25.9, adult: Secondary | ICD-10-CM | POA: Diagnosis not present

## 2024-03-25 DIAGNOSIS — L988 Other specified disorders of the skin and subcutaneous tissue: Secondary | ICD-10-CM | POA: Diagnosis not present

## 2024-03-25 DIAGNOSIS — N951 Menopausal and female climacteric states: Secondary | ICD-10-CM | POA: Diagnosis not present

## 2024-03-25 MED ORDER — TRETINOIN 0.05 % EX CREA
1.0000 | TOPICAL_CREAM | Freq: Every day | CUTANEOUS | 3 refills | Status: AC
  Filled 2024-03-25: qty 45, 30d supply, fill #0

## 2024-04-03 ENCOUNTER — Other Ambulatory Visit (HOSPITAL_COMMUNITY): Payer: Self-pay

## 2024-04-13 DIAGNOSIS — Z1231 Encounter for screening mammogram for malignant neoplasm of breast: Secondary | ICD-10-CM | POA: Diagnosis not present

## 2024-04-13 LAB — HM MAMMOGRAPHY

## 2024-04-14 ENCOUNTER — Encounter: Payer: Self-pay | Admitting: Medical

## 2024-04-14 ENCOUNTER — Ambulatory Visit: Payer: Self-pay | Admitting: Medical

## 2024-04-14 NOTE — Progress Notes (Signed)
 Results through my chart
